# Patient Record
Sex: Female | Born: 1955
Health system: Southern US, Community
[De-identification: ages and names within clinical notes are randomized; demographics above are authoritative.]

## PROBLEM LIST (undated history)

## (undated) DIAGNOSIS — N6019 Diffuse cystic mastopathy of unspecified breast: Secondary | ICD-10-CM

## (undated) DIAGNOSIS — K639 Disease of intestine, unspecified: Secondary | ICD-10-CM

## (undated) DIAGNOSIS — K219 Gastro-esophageal reflux disease without esophagitis: Secondary | ICD-10-CM

## (undated) DIAGNOSIS — G44209 Tension-type headache, unspecified, not intractable: Secondary | ICD-10-CM

## (undated) HISTORY — DX: Gastro-esophageal reflux disease without esophagitis: K21.9

## (undated) HISTORY — PX: TUBAL LIGATION: SHX77

## (undated) HISTORY — PX: APPENDECTOMY: SHX54

## (undated) HISTORY — DX: Diffuse cystic mastopathy of unspecified breast: N60.19

## (undated) HISTORY — DX: Tension-type headache, unspecified, not intractable: G44.209

## (undated) HISTORY — PX: TONSILLECTOMY AND ADENOIDECTOMY: SHX28

## (undated) HISTORY — PX: COLONOSCOPY: SHX174

## (undated) HISTORY — PX: ABDOMINAL EXPLORATION SURGERY: SHX538

## (undated) HISTORY — DX: Disease of intestine, unspecified: K63.9

## (undated) HISTORY — PX: ABDOMINAL HYSTERECTOMY: SHX81

## (undated) HISTORY — PX: FRACTURE SURGERY: SHX138

---

## 1993-12-02 DIAGNOSIS — K639 Disease of intestine, unspecified: Secondary | ICD-10-CM

## 1993-12-02 HISTORY — DX: Disease of intestine, unspecified: K63.9

## 1994-12-02 HISTORY — PX: TOTAL ABDOMINAL HYSTERECTOMY W/ BILATERAL SALPINGOOPHORECTOMY: SHX83

## 2005-02-13 ENCOUNTER — Ambulatory Visit: Payer: Self-pay | Admitting: Internal Medicine

## 2005-10-07 ENCOUNTER — Ambulatory Visit: Payer: Self-pay | Admitting: Internal Medicine

## 2005-10-08 ENCOUNTER — Ambulatory Visit: Payer: Self-pay | Admitting: Internal Medicine

## 2006-03-14 ENCOUNTER — Ambulatory Visit: Payer: Self-pay | Admitting: Podiatry

## 2006-03-24 ENCOUNTER — Ambulatory Visit: Payer: Self-pay | Admitting: Internal Medicine

## 2006-10-28 ENCOUNTER — Ambulatory Visit: Payer: Self-pay | Admitting: Internal Medicine

## 2007-05-04 ENCOUNTER — Ambulatory Visit: Payer: Self-pay | Admitting: Unknown Physician Specialty

## 2007-07-24 DIAGNOSIS — G4700 Insomnia, unspecified: Secondary | ICD-10-CM | POA: Insufficient documentation

## 2007-07-27 DIAGNOSIS — F17200 Nicotine dependence, unspecified, uncomplicated: Secondary | ICD-10-CM | POA: Insufficient documentation

## 2007-07-27 DIAGNOSIS — K219 Gastro-esophageal reflux disease without esophagitis: Secondary | ICD-10-CM | POA: Insufficient documentation

## 2007-07-27 DIAGNOSIS — G44209 Tension-type headache, unspecified, not intractable: Secondary | ICD-10-CM | POA: Insufficient documentation

## 2007-11-17 ENCOUNTER — Ambulatory Visit: Payer: Self-pay | Admitting: Family Medicine

## 2008-10-18 ENCOUNTER — Ambulatory Visit: Payer: Self-pay | Admitting: Family Medicine

## 2008-11-01 DIAGNOSIS — F419 Anxiety disorder, unspecified: Secondary | ICD-10-CM | POA: Insufficient documentation

## 2008-11-17 ENCOUNTER — Ambulatory Visit: Payer: Self-pay | Admitting: Family Medicine

## 2008-11-23 ENCOUNTER — Ambulatory Visit: Payer: Self-pay | Admitting: Family Medicine

## 2008-12-02 HISTORY — PX: FOOT SURGERY: SHX648

## 2009-05-17 DIAGNOSIS — E559 Vitamin D deficiency, unspecified: Secondary | ICD-10-CM | POA: Insufficient documentation

## 2009-08-16 ENCOUNTER — Ambulatory Visit: Payer: Self-pay | Admitting: Family Medicine

## 2009-08-21 ENCOUNTER — Ambulatory Visit: Payer: Self-pay | Admitting: Family Medicine

## 2009-09-04 ENCOUNTER — Encounter: Payer: Self-pay | Admitting: Cardiology

## 2009-10-13 ENCOUNTER — Ambulatory Visit: Payer: Self-pay | Admitting: Unknown Physician Specialty

## 2009-12-05 DIAGNOSIS — N951 Menopausal and female climacteric states: Secondary | ICD-10-CM | POA: Insufficient documentation

## 2009-12-05 DIAGNOSIS — J309 Allergic rhinitis, unspecified: Secondary | ICD-10-CM | POA: Insufficient documentation

## 2009-12-19 ENCOUNTER — Ambulatory Visit: Payer: Self-pay | Admitting: Family Medicine

## 2010-05-01 ENCOUNTER — Encounter: Admission: RE | Admit: 2010-05-01 | Discharge: 2010-05-01 | Payer: Self-pay | Admitting: Unknown Physician Specialty

## 2010-10-10 ENCOUNTER — Encounter: Admission: RE | Admit: 2010-10-10 | Discharge: 2010-10-10 | Payer: Self-pay | Admitting: Unknown Physician Specialty

## 2010-12-02 DIAGNOSIS — N6019 Diffuse cystic mastopathy of unspecified breast: Secondary | ICD-10-CM

## 2010-12-02 DIAGNOSIS — G4730 Sleep apnea, unspecified: Secondary | ICD-10-CM

## 2010-12-02 HISTORY — DX: Sleep apnea, unspecified: G47.30

## 2010-12-02 HISTORY — DX: Diffuse cystic mastopathy of unspecified breast: N60.19

## 2011-01-15 LAB — HM PAP SMEAR: HM Pap smear: NEGATIVE

## 2011-01-16 ENCOUNTER — Ambulatory Visit: Payer: Self-pay | Admitting: Family Medicine

## 2011-02-05 ENCOUNTER — Ambulatory Visit: Payer: Self-pay | Admitting: Family Medicine

## 2011-02-07 ENCOUNTER — Ambulatory Visit: Payer: Self-pay | Admitting: Family Medicine

## 2011-07-05 ENCOUNTER — Ambulatory Visit: Payer: Self-pay | Admitting: Otolaryngology

## 2011-07-26 ENCOUNTER — Ambulatory Visit: Payer: Self-pay | Admitting: Otolaryngology

## 2012-02-06 ENCOUNTER — Ambulatory Visit: Payer: Self-pay | Admitting: General Surgery

## 2013-01-09 ENCOUNTER — Encounter: Payer: Self-pay | Admitting: *Deleted

## 2013-01-09 DIAGNOSIS — K639 Disease of intestine, unspecified: Secondary | ICD-10-CM | POA: Insufficient documentation

## 2013-01-09 DIAGNOSIS — G4730 Sleep apnea, unspecified: Secondary | ICD-10-CM | POA: Insufficient documentation

## 2013-02-09 ENCOUNTER — Encounter: Payer: Self-pay | Admitting: *Deleted

## 2013-02-09 ENCOUNTER — Ambulatory Visit: Payer: Self-pay | Admitting: General Surgery

## 2013-02-16 ENCOUNTER — Encounter: Payer: Self-pay | Admitting: *Deleted

## 2013-02-17 ENCOUNTER — Ambulatory Visit (INDEPENDENT_AMBULATORY_CARE_PROVIDER_SITE_OTHER): Payer: BC Managed Care – PPO | Admitting: General Surgery

## 2013-02-17 ENCOUNTER — Encounter: Payer: Self-pay | Admitting: General Surgery

## 2013-02-17 VITALS — BP 104/60 | HR 88 | Resp 16 | Ht 64.0 in | Wt 183.0 lb

## 2013-02-17 DIAGNOSIS — N6019 Diffuse cystic mastopathy of unspecified breast: Secondary | ICD-10-CM | POA: Insufficient documentation

## 2013-02-17 NOTE — Progress Notes (Signed)
Subjective:     Patient ID: Theresa White, female   DOB: [birth year], 57 y.o.   MRN: [id number]  HPI Patient present for a follow up mammogram done on 02/09/13 with birad category 2. No new complaints with her breasts. No complaints at this time.    Review of Systems  Constitutional: Negative.   Respiratory: Negative.   Cardiovascular: Negative.        Objective:   Physical Exam  Constitutional: She appears well-developed and well-nourished.  Eyes: No scleral icterus.  Cardiovascular: Normal rate, regular rhythm and normal heart sounds.   Pulmonary/Chest: Right breast exhibits tenderness. Right breast exhibits no inverted nipple, no mass, no nipple discharge and no skin change. Left breast exhibits tenderness. Left breast exhibits no inverted nipple, no mass, no nipple discharge and no skin change. Breasts are symmetrical.  Abdominal: Soft. Normal appearance and bowel sounds are normal.  Lymphadenopathy:    She has no cervical adenopathy.    She has no axillary adenopathy.  Neurological: She is alert.       Assessment:     Pt with FCD , stable exam. Mammogram reviewd-stable dense , nodular pattern    Plan:     F/U in 1 yr

## 2014-02-10 ENCOUNTER — Ambulatory Visit: Payer: Self-pay | Admitting: General Surgery

## 2014-02-14 ENCOUNTER — Encounter: Payer: Self-pay | Admitting: General Surgery

## 2014-02-15 NOTE — Progress Notes (Signed)
Quick Note:  Make sure the additional views are done prior to her office visit ______ 

## 2014-02-17 ENCOUNTER — Ambulatory Visit: Payer: Self-pay | Admitting: General Surgery

## 2014-02-20 ENCOUNTER — Encounter: Payer: Self-pay | Admitting: General Surgery

## 2014-02-22 ENCOUNTER — Ambulatory Visit (INDEPENDENT_AMBULATORY_CARE_PROVIDER_SITE_OTHER): Payer: BC Managed Care – PPO | Admitting: General Surgery

## 2014-02-22 ENCOUNTER — Encounter: Payer: Self-pay | Admitting: General Surgery

## 2014-02-22 VITALS — BP 140/82 | HR 78 | Resp 12 | Ht 63.0 in | Wt 191.0 lb

## 2014-02-22 DIAGNOSIS — Z86010 Personal history of colonic polyps: Secondary | ICD-10-CM

## 2014-02-22 DIAGNOSIS — N6019 Diffuse cystic mastopathy of unspecified breast: Secondary | ICD-10-CM

## 2014-02-22 NOTE — Patient Instructions (Addendum)
Patient to return in one year for bilateral screening mammogram. Continue self breast exams. Call office for any new breast issues or concerns. Colonoscopy A colonoscopy is an exam to look at the entire large intestine (colon). This exam can help find problems such as tumors, polyps, inflammation, and areas of bleeding. The exam takes about 1 hour.  LET [facility] CARE PROVIDER KNOW ABOUT:   Any allergies you have.  All medicines you are taking, including vitamins, herbs, eye drops, creams, and over-the-counter medicines.  Previous problems you or members of your family have had with the use of anesthetics.  Any blood disorders you have.  Previous surgeries you have had.  Medical conditions you have. RISKS AND COMPLICATIONS  Generally, this is a safe procedure. However, as with any procedure, complications can occur. Possible complications include:  Bleeding.  Tearing or rupture of the colon wall.  Reaction to medicines given during the exam.  Infection (rare). BEFORE THE PROCEDURE   Ask your health care provider about changing or stopping your regular medicines.  You may be prescribed an oral bowel prep. This involves drinking a large amount of medicated liquid, starting the day before your procedure. The liquid will cause you to have multiple loose stools until your stool is almost clear or light green. This cleans out your colon in preparation for the procedure.  Do not eat or drink anything else once you have started the bowel prep, unless your health care provider tells you it is safe to do so.  Arrange for someone to drive you home after the procedure. PROCEDURE   You will be given medicine to help you relax (sedative).  You will lie on your side with your knees bent.  A long, flexible tube with a light and camera on the end (colonoscope) will be inserted through the rectum and into the colon. The camera sends video back to a computer screen as it moves through the  colon. The colonoscope also releases carbon dioxide gas to inflate the colon. This helps your health care provider see the area better.  During the exam, your health care provider may take a small tissue sample (biopsy) to be examined under a microscope if any abnormalities are found.  The exam is finished when the entire colon has been viewed. AFTER THE PROCEDURE   Do not drive for 24 hours after the exam.  You may have a small amount of blood in your stool.  You may pass moderate amounts of gas and have mild abdominal cramping or bloating. This is caused by the gas used to inflate your colon during the exam.  Ask when your test results will be ready and how you will get your results. Make sure you get your test results. Document Released: 11/15/2000 Document Revised: 09/08/2013 Document Reviewed: 07/26/2013 [facility] Patient Information 2014 [place].

## 2014-02-22 NOTE — Progress Notes (Signed)
Patient ID: Theresa White, female   DOB: [birth year], 58 y.o.   MRN: [id number]  Chief Complaint  Patient presents with  . Follow-up    mammogram    HPI Theresa White is a 58 y.o. female.  who presents for her follow up mammogram and breast evaluation. The most recent mammogram was done on 02-10-14.  Patient does perform regular self breast checks and gets regular mammograms done.  No new breast complaints.   HPI  Past Medical History  Diagnosis Date  . Allergic rhinitis   . Colon polyps   . GERD (gastroesophageal reflux disease)   . Tension headache   . Chest pain   . Diffuse cystic mastopathy 2012  . Bowel trouble 1995  . Medical history non-contributory   . Sleep apnea 2012    Past Surgical History  Procedure Laterality Date  . Vaginal hysterectomy      Laparoscopically assisted with bilateral salpingo-oophorectomy, pelvic pain  . Tonsillectomy and adenoidectomy    . Abdominal exploration surgery    . Colonoscopy  2010    DR. Elliott  . Abdominal hysterectomy  1996  . Foot surgery Left 2010    Family History  Problem Relation Age of Onset  . Kidney failure Mother   . Stroke Mother   . Diabetes Mother   . Emphysema Father   . Hypertension Father   . Aneurysm Sister     Cerebral  . Cervical cancer Sister 23    Social History History  Substance Use Topics  . Smoking status: Former Smoker -- 1.00 packs/day for 30 years    Quit date: 01/29/2013  . Smokeless tobacco: Never Used  . Alcohol Use: No    Allergies  Allergen Reactions  . Codeine Itching    Current Outpatient Prescriptions  Medication Sig Dispense Refill  . Ascorbic Acid (VITAMIN C PO) Take by mouth as directed.      Marland Kitchen aspirin 81 MG tablet Take 81 mg by mouth daily.      Marland Kitchen buPROPion (WELLBUTRIN SR) 150 MG 12 hr tablet Take 150 mg by mouth daily.       Marland Kitchen CALCIUM PO Take by mouth daily.      . Cyanocobalamin (VITAMIN B-12 PO) Take by mouth daily.      Marland Kitchen esomeprazole (NEXIUM) 40 MG capsule Take 40 mg  by mouth daily before breakfast.      . estrogens, conjugated, (PREMARIN) 0.45 MG tablet Take 0.45 mg by mouth as directed. Take daily for 21 days then do not take for 7 days.      Marland Kitchen loratadine (CLARITIN) 10 MG tablet Take 10 mg by mouth daily.      . montelukast (SINGULAIR) 10 MG tablet Take 1 tablet by mouth daily.      . Multiple Vitamins-Minerals (MULTIVITAMIN PO) Take 1 tablet by mouth daily.      . Omega-3 Fatty Acids (FISH OIL PO) Take by mouth daily.      Marland Kitchen VITAMIN D, CHOLECALCIFEROL, PO Take 1 tablet by mouth daily.       No current facility-administered medications for this visit.    Review of Systems Review of Systems  Constitutional: Negative.   Respiratory: Negative.   Cardiovascular: Negative.     Blood pressure 140/82, pulse 78, resp. rate 12, height 5\' 3"  (1.6 m), weight 191 lb (86.637 kg).  Physical Exam Physical Exam  Constitutional: She is oriented to person, place, and time. She appears well-developed and well-nourished.  Eyes: Conjunctivae are normal. No  scleral icterus.  Neck: Neck supple. No thyromegaly present.  Cardiovascular: Normal rate, regular rhythm and normal heart sounds.   Pulmonary/Chest: Effort normal and breath sounds normal. Right breast exhibits no inverted nipple, no mass, no nipple discharge, no skin change and no tenderness. Left breast exhibits no inverted nipple, no mass, no nipple discharge, no skin change and no tenderness.  Lymphadenopathy:    She has no cervical adenopathy.    She has no axillary adenopathy.  Neurological: She is alert and oriented to person, place, and time.  Skin: Skin is warm and dry.    Data Reviewed Mammogram reviewed. Nodules seen on both sides- US showing cysts.  Assessment    Stable exam. History of multiple breast cysts in past. PH of colon p[olyps. She is due this yr for surveillance colonoscopy- last one done in 2010    Plan    Patient to return in one year with bilateral screening mammogram.    Colonoscopy with possible biopsy/polypectomy prn: Information regarding the procedure, including its potential risks and complications (including but not limited to perforation of the bowel, which may require emergency surgery to repair, and bleeding) was verbally given to the patient. Educational information regarding lower instestinal endoscopy was given to the patient. Written instructions for how to complete the bowel prep using Miralax were provided. The importance of drinking ample fluids to avoid dehydration as a result of the prep emphasized.   Patient will contact the office when ready to arrange colonoscopy (it may be sometime later this year). We can go ahead and arrange accordingly when patient calls. She will not require a pre-op visit.    Theresa White G 02/22/2014, 4:33 PM

## 2014-09-19 ENCOUNTER — Ambulatory Visit (INDEPENDENT_AMBULATORY_CARE_PROVIDER_SITE_OTHER): Payer: BC Managed Care – PPO | Admitting: General Surgery

## 2014-09-19 ENCOUNTER — Other Ambulatory Visit: Payer: BC Managed Care – PPO

## 2014-09-19 ENCOUNTER — Encounter: Payer: Self-pay | Admitting: General Surgery

## 2014-09-19 VITALS — BP 116/70 | HR 80 | Resp 12 | Ht 64.0 in | Wt 175.0 lb

## 2014-09-19 DIAGNOSIS — N6320 Unspecified lump in the left breast, unspecified quadrant: Secondary | ICD-10-CM

## 2014-09-19 DIAGNOSIS — Z86010 Personal history of colonic polyps: Secondary | ICD-10-CM

## 2014-09-19 DIAGNOSIS — N63 Unspecified lump in breast: Secondary | ICD-10-CM

## 2014-09-19 DIAGNOSIS — N6002 Solitary cyst of left breast: Secondary | ICD-10-CM

## 2014-09-19 MED ORDER — POLYETHYLENE GLYCOL 3350 17 GM/SCOOP PO POWD: ORAL | Status: DC

## 2014-09-19 NOTE — Progress Notes (Signed)
Patient ID: Theresa White, female   DOB: Aug 09, 1956, 58 y.o.   MRN: [id number]  Chief Complaint  Patient presents with  . Follow-up    breast cysts    HPI Theresa White is a 58 y.o. female here today for an breast check. She states her breast are tender. She feels a lump in the left breast that she noticed approximately 2 weeks ago. She believes it is a cyst. The lump has not changed in size that she has noticed. She denies any other problems at this time.   HPI  Past Medical History  Diagnosis Date  . Allergic rhinitis   . Colon polyps   . GERD (gastroesophageal reflux disease)   . Tension headache   . Chest pain   . Diffuse cystic mastopathy 2012  . Bowel trouble 1995  . Medical history non-contributory   . Sleep apnea 2012  . Colon polyps     Past Surgical History  Procedure Laterality Date  . Vaginal hysterectomy      Laparoscopically assisted with bilateral salpingo-oophorectomy, pelvic pain  . Tonsillectomy and adenoidectomy    . Abdominal exploration surgery    . Colonoscopy  2010    DR. Elliott  . Abdominal hysterectomy  1996  . Foot surgery Left 2010    Family History  Problem Relation Age of Onset  . Kidney failure Mother   . Stroke Mother   . Diabetes Mother   . Emphysema Father   . Hypertension Father   . Aneurysm Sister     Cerebral  . Cervical cancer Sister 5    Social History History  Substance Use Topics  . Smoking status: Current Every Day Smoker -- 0.75 packs/day for 30 years    Types: Cigarettes    Last Attempt to Quit: 01/29/2013  . Smokeless tobacco: Never Used  . Alcohol Use: No    Allergies  Allergen Reactions  . Codeine Itching    Current Outpatient Prescriptions  Medication Sig Dispense Refill  . Ascorbic Acid (VITAMIN C PO) Take by mouth as directed.      Marland Kitchen aspirin 81 MG tablet Take 81 mg by mouth daily.      Marland Kitchen buPROPion (WELLBUTRIN SR) 150 MG 12 hr tablet Take 150 mg by mouth daily.       Marland Kitchen CALCIUM PO Take by mouth daily.       . Cyanocobalamin (VITAMIN B-12 PO) Take by mouth daily.      Marland Kitchen esomeprazole (NEXIUM) 40 MG capsule Take 40 mg by mouth daily before breakfast.      . estrogens, conjugated, (PREMARIN) 0.45 MG tablet Take 0.45 mg by mouth as directed. Take daily for 21 days then do not take for 7 days.      Marland Kitchen loratadine (CLARITIN) 10 MG tablet Take 10 mg by mouth daily.      . montelukast (SINGULAIR) 10 MG tablet Take 1 tablet by mouth daily.      . Multiple Vitamins-Minerals (MULTIVITAMIN PO) Take 1 tablet by mouth daily.      . Omega-3 Fatty Acids (FISH OIL PO) Take by mouth daily.      . polyethylene glycol powder (GLYCOLAX/MIRALAX) powder 255 grams one bottle for colonoscopy prep  255 g  0  . VITAMIN D, CHOLECALCIFEROL, PO Take 1 tablet by mouth daily.       No current facility-administered medications for this visit.    Review of Systems Review of Systems  Constitutional: Negative.   Respiratory: Negative.  Cardiovascular: Negative.     Blood pressure 116/70, pulse 80, resp. rate 12, height 5\' 4"  (1.626 m), weight 175 lb (79.379 kg).  Physical Exam Physical Exam  Constitutional: She is oriented to person, place, and time. She appears well-developed and well-nourished.  Eyes: Conjunctivae are normal. No scleral icterus.  Neck: Neck supple. No thyromegaly present.  Cardiovascular: Normal rate, regular rhythm and normal heart sounds.   No murmur heard. Pulmonary/Chest: Effort normal and breath sounds normal. Right breast exhibits no inverted nipple, no mass, no nipple discharge, no skin change and no tenderness. Left breast exhibits mass (2.5 cm ill defined thickening 3 oclock left breast 5 cm from nipple. ). Left breast exhibits no inverted nipple, no nipple discharge, no skin change and no tenderness.  Lymphadenopathy:    She has no cervical adenopathy.    She has no axillary adenopathy.  Neurological: She is alert and oriented to person, place, and time.  Skin: Skin is warm and dry.     Data Reviewed [place] targeted over the palpable mass on left reveals a 2cm cyst and a small cyst medial to it..  Assessment    Left breast mass at 3 oclock 5 cm from nipple present. Pt with some pain. Cyst aspirated and resolved    Plan    Cyst aspiration cpmplete. F/u as scheduled next yr. Patient wishes to schedule surveillance colonoscopy. She is five years from prior. Okay to scheduled this.     Patient has been scheduled for a colonoscopy on 09-28-14 at [facility]. This patient has been asked to discontinue fish oil one week prior to procedure. It is okay for patient to continue 81 mg aspirin.    Ritta Hammes G 09/20/2014, 7:36 AM

## 2014-09-19 NOTE — Patient Instructions (Addendum)
Patient to be scheduled for colonoscopy. Continue self breast exams. Call office for any new breast issues or concerns.  Patient has been scheduled for a colonoscopy on 09-28-14 at [facility]. This patient has been asked to discontinue fish oil one week prior to procedure. It is okay for patient to continue 81 mg aspirin.

## 2014-09-20 ENCOUNTER — Encounter: Payer: Self-pay | Admitting: General Surgery

## 2014-09-20 DIAGNOSIS — Z860100 Personal history of colon polyps, unspecified: Secondary | ICD-10-CM | POA: Insufficient documentation

## 2014-09-28 ENCOUNTER — Ambulatory Visit: Payer: Self-pay | Admitting: General Surgery

## 2014-09-28 DIAGNOSIS — K5730 Diverticulosis of large intestine without perforation or abscess without bleeding: Secondary | ICD-10-CM

## 2014-09-28 DIAGNOSIS — Z86010 Personal history of colonic polyps: Secondary | ICD-10-CM

## 2014-09-28 DIAGNOSIS — D125 Benign neoplasm of sigmoid colon: Secondary | ICD-10-CM

## 2014-09-29 ENCOUNTER — Encounter: Payer: Self-pay | Admitting: General Surgery

## 2014-10-03 ENCOUNTER — Encounter: Payer: Self-pay | Admitting: General Surgery

## 2014-10-04 ENCOUNTER — Telehealth: Payer: Self-pay | Admitting: *Deleted

## 2014-10-04 NOTE — Telephone Encounter (Signed)
Notified patient as instructed, patient pleased. She is concerned about still having abdominal pain and tenderness. She states it does seem to be a shooting pain when she passes flatus, BM or voids. Denies fever, nausea or vomiting.

## 2014-10-04 NOTE — Telephone Encounter (Signed)
-----   Message from Christene Lye, MD sent at 10/04/2014 11:16 AM EST ----- Please let pt pt know the pathology was normal.

## 2014-10-14 ENCOUNTER — Ambulatory Visit: Payer: Self-pay | Admitting: Family Medicine

## 2015-02-14 ENCOUNTER — Encounter: Payer: Self-pay | Admitting: General Surgery

## 2015-02-22 ENCOUNTER — Ambulatory Visit: Payer: Self-pay | Admitting: General Surgery

## 2015-02-28 ENCOUNTER — Encounter: Payer: Self-pay | Admitting: General Surgery

## 2015-02-28 ENCOUNTER — Ambulatory Visit (INDEPENDENT_AMBULATORY_CARE_PROVIDER_SITE_OTHER): Payer: BLUE CROSS/BLUE SHIELD | Admitting: General Surgery

## 2015-02-28 VITALS — BP 130/74 | HR 77 | Resp 14 | Ht 64.0 in | Wt 174.0 lb

## 2015-02-28 DIAGNOSIS — N6019 Diffuse cystic mastopathy of unspecified breast: Secondary | ICD-10-CM

## 2015-02-28 NOTE — Patient Instructions (Signed)
Follow up in one year with mammogram and office visit.  

## 2015-02-28 NOTE — Progress Notes (Signed)
Patient ID: Theresa White, female   DOB: Sep 22, 1956, 59 y.o.   MRN: [id number]  Chief Complaint  Patient presents with  . Follow-up    mammogram    HPI Theresa White is a 59 y.o. female who presents for a breast evaluation. The most recent mammogram was done on 02/13/15. Patient does perform regular self breast checks and gets regular mammograms done. She denies any new breast problems. Colonoscopy completed last year showing diverticulosis and one hyperplastic polyp.  HPI  Past Medical History  Diagnosis Date  . Allergic rhinitis   . Colon polyps   . GERD (gastroesophageal reflux disease)   . Tension headache   . Chest pain   . Diffuse cystic mastopathy 2012  . Bowel trouble 1995  . Medical history non-contributory   . Sleep apnea 2012  . Colon polyps   . Colon polyp, hyperplastic 09/29/15    Past Surgical History  Procedure Laterality Date  . Vaginal hysterectomy      Laparoscopically assisted with bilateral salpingo-oophorectomy, pelvic pain  . Tonsillectomy and adenoidectomy    . Abdominal exploration surgery    . Colonoscopy  2010, 09/28/14    DR. Elliott  . Abdominal hysterectomy  1996  . Foot surgery Left 2010    Family History  Problem Relation Age of Onset  . Kidney failure Mother   . Stroke Mother   . Diabetes Mother   . Emphysema Father   . Hypertension Father   . Aneurysm Sister     Cerebral  . Cervical cancer Sister 26  . Bladder Cancer Brother 30    Social History History  Substance Use Topics  . Smoking status: Current Every Day Smoker -- 0.75 packs/day for 30 years    Types: Cigarettes    Last Attempt to Quit: 01/29/2013  . Smokeless tobacco: Never Used  . Alcohol Use: No    Allergies  Allergen Reactions  . Codeine Itching    Current Outpatient Prescriptions  Medication Sig Dispense Refill  . Ascorbic Acid (VITAMIN C PO) Take by mouth as directed.    Marland Kitchen aspirin 81 MG tablet Take 81 mg by mouth daily.    Marland Kitchen CALCIUM PO Take by mouth  daily.    . Cyanocobalamin (VITAMIN B-12 PO) Take by mouth daily.    Marland Kitchen esomeprazole (NEXIUM) 40 MG capsule Take 40 mg by mouth daily before breakfast.    . estrogens, conjugated, (PREMARIN) 0.45 MG tablet Take 0.45 mg by mouth as directed. Take daily for 21 days then do not take for 7 days.    Marland Kitchen loratadine (CLARITIN) 10 MG tablet Take 10 mg by mouth daily.    . montelukast (SINGULAIR) 10 MG tablet Take 1 tablet by mouth daily.    . Omega-3 Fatty Acids (FISH OIL PO) Take by mouth daily.    Marland Kitchen VITAMIN D, CHOLECALCIFEROL, PO Take 1 tablet by mouth daily.     No current facility-administered medications for this visit.    Review of Systems Review of Systems  Constitutional: Negative.   Respiratory: Negative.   Cardiovascular: Negative.     Blood pressure 130/74, pulse 77, resp. rate 14, height 5\' 4"  (1.626 m), weight 174 lb (78.926 kg).  Physical Exam Physical Exam  Constitutional: She appears well-developed and well-nourished.  Eyes: Conjunctivae are normal. No scleral icterus.  Neck: Neck supple.  Cardiovascular: Normal rate, regular rhythm and normal heart sounds.   Pulmonary/Chest: Effort normal and breath sounds normal. Right breast exhibits no inverted nipple, no  mass, no nipple discharge, no skin change and no tenderness. Left breast exhibits no inverted nipple, no mass, no nipple discharge, no skin change and no tenderness.  Abdominal: Soft. Normal appearance. There is no hepatosplenomegaly. There is no tenderness.  Lymphadenopathy:    She has no cervical adenopathy.    She has no axillary adenopathy.  Neurological: She is alert.    Data Reviewed Mammogram reviewed-stable  Assessment    Stable exam. FCD. History of colon polyps     Plan   Colonoscopy due in 2020 Follow up in one year with bilateral screening mammogram and office visit        SANKAR,SEEPLAPUTHUR G 02/28/2015, 4:16 PM

## 2015-03-22 IMAGING — CT CT HEAD WITHOUT AND WITH CONTRAST
1 of 2 series · 13 of 30 positions shown, 17 images · IV contrast (agent unspecified)
Comparison: MRI 10/07/2005.

CLINICAL DATA: Recent loss of consciousness, 09/23/2014. Recent
episode of dizziness. Vertigo.

EXAM:
CT HEAD WITHOUT AND WITH CONTRAST
TECHNIQUE: Contiguous axial images were obtained from the base of the skull
through the vertex without and with intravenous contrast
CONTRAST:  75 cc [id number]

[Series 2: head wo · axial · 0.39mm/px · z∈[+637,+757]mm · 13 of 29 slices shown, 17 images]
[im 3/29  brain]
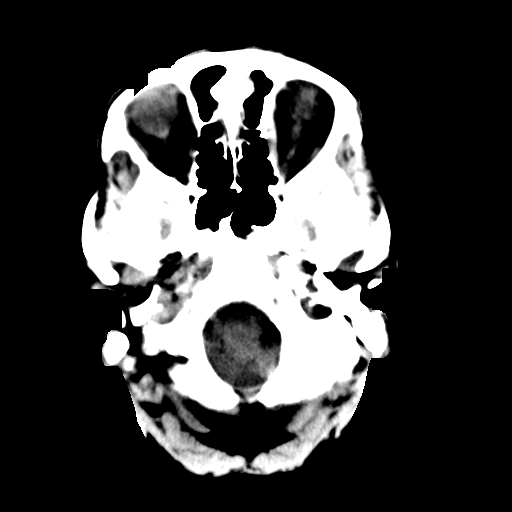
[im 3/29  bone]
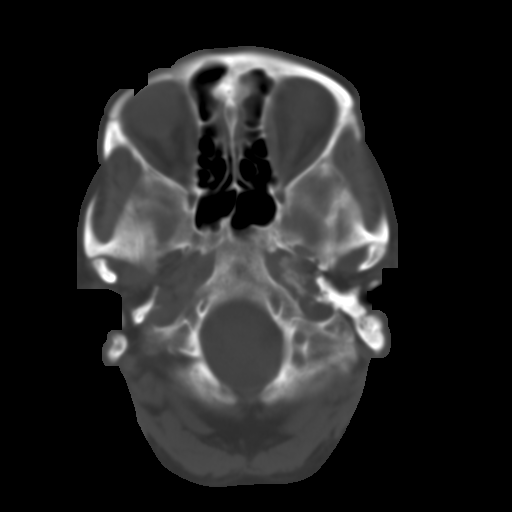
[im 5/29  brain]
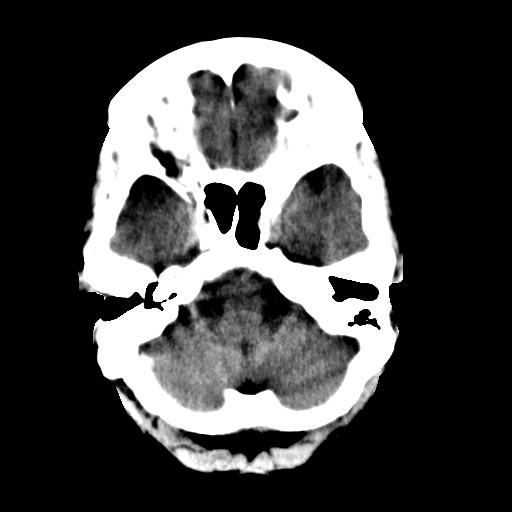
[im 7/29  brain]
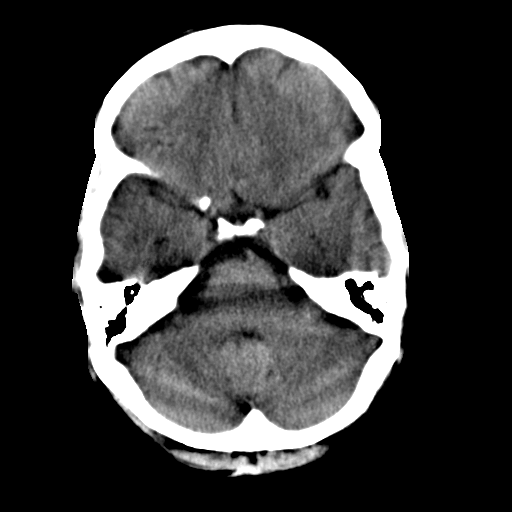
[im 9/29  brain]
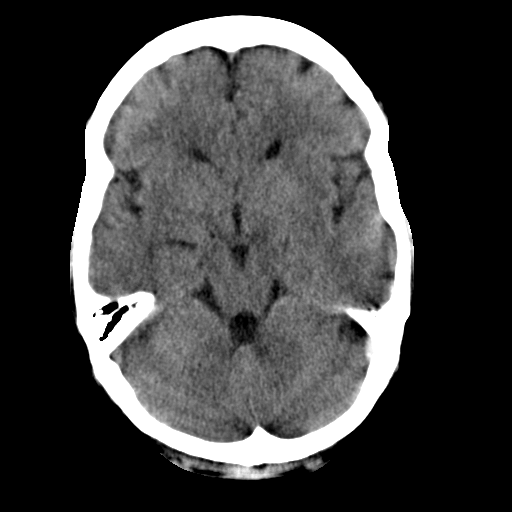
[im 11/29  brain]
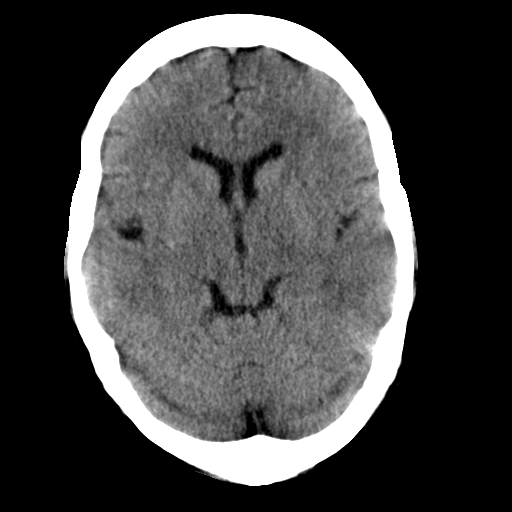
[im 11/29  bone]
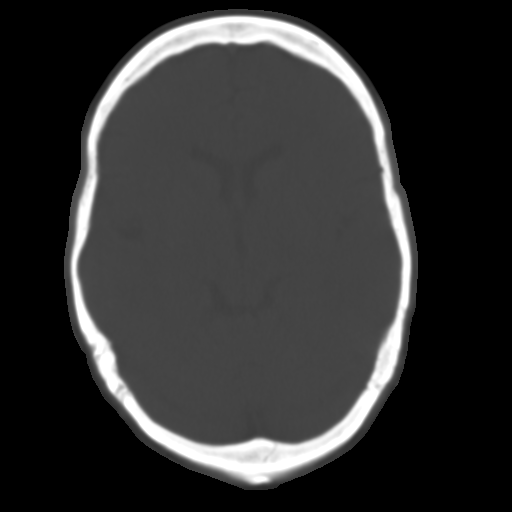
[im 13/29  brain]
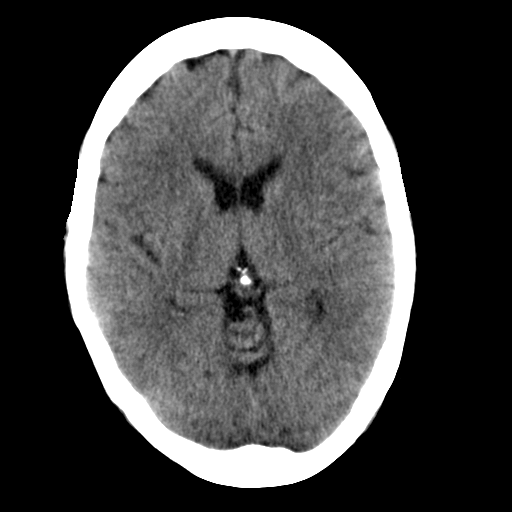
[im 15/29  brain]
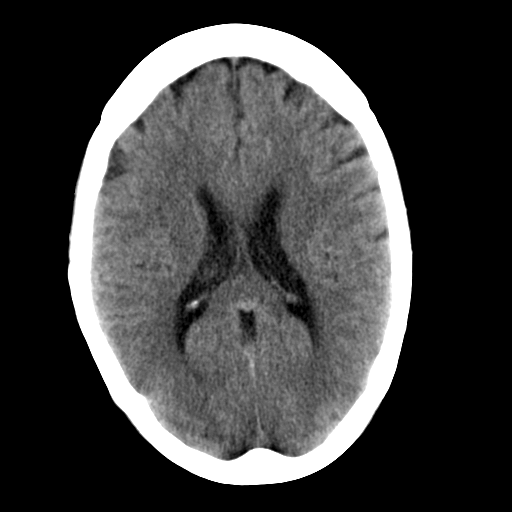
[im 17/29  brain]
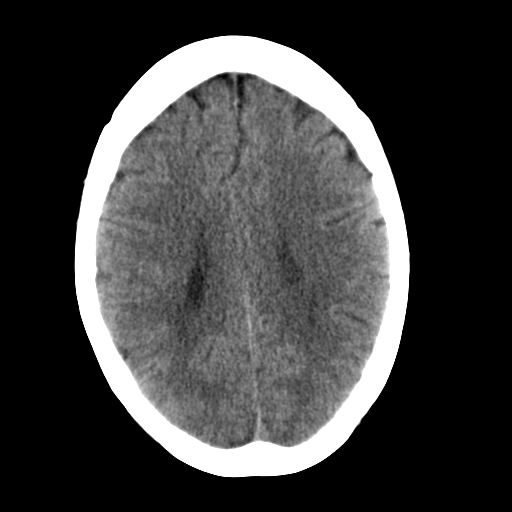
[im 19/29  brain]
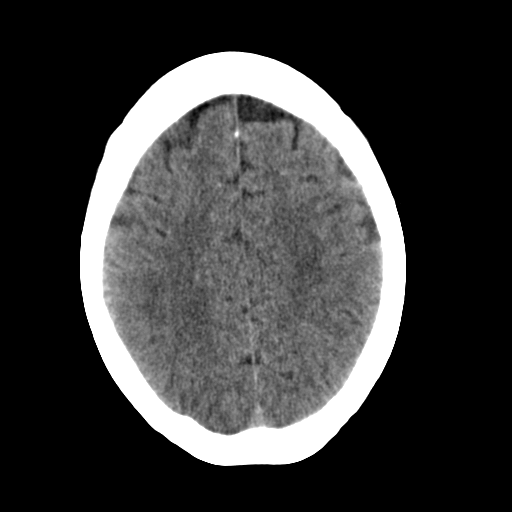
[im 19/29  bone]
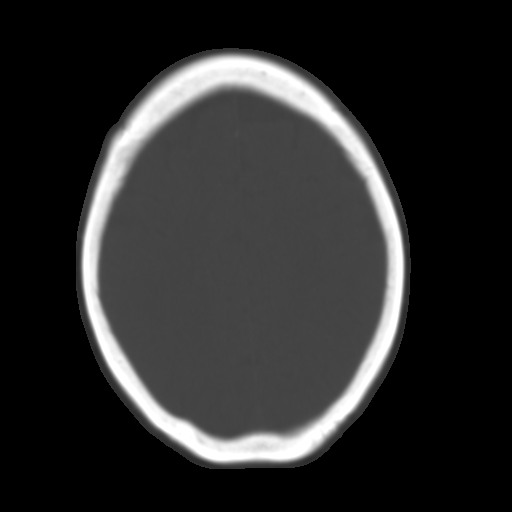
[im 21/29  brain]
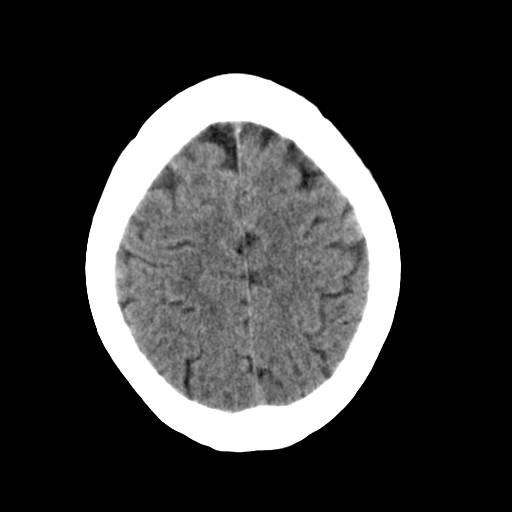
[im 23/29  brain]
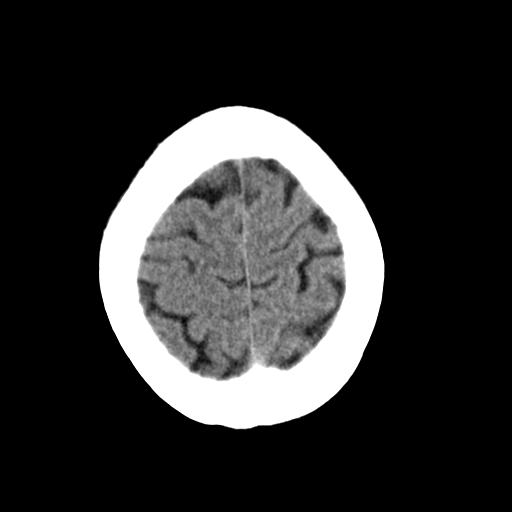
[im 25/29  brain]
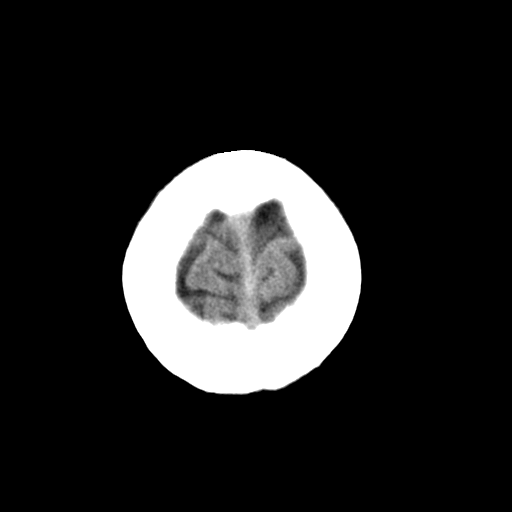
[im 27/29  brain]
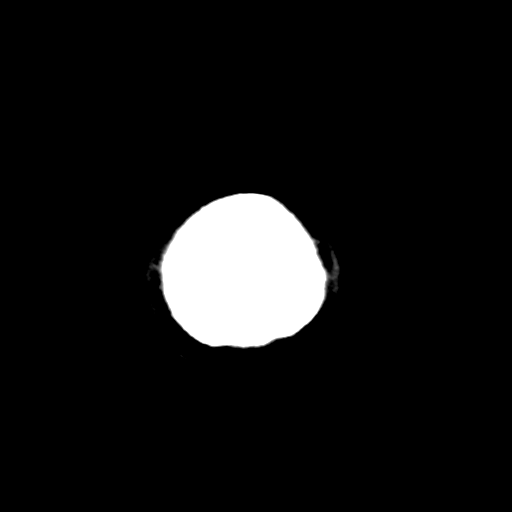
[im 27/29  bone]
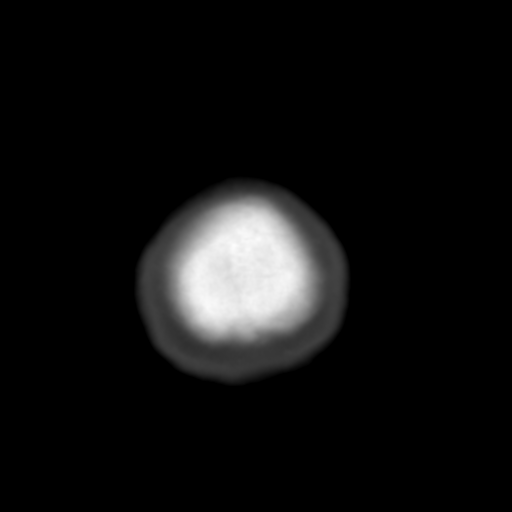

[13 of 30 positions shown; findings below may reference images not displayed]

FINDINGS: There is no evidence of acute infarction, mass lesion, hemorrhage,
hydrocephalus or extra-axial collection. There chronic appearing
small vessel ischemic changes in the white matter, most notable in
the right parietal region. No abnormal contrast enhancement. No
calvarial abnormality. Visualized sinuses, middle ears and mastoids
are clear.
IMPRESSION: No acute or significant finding. Chronic small-vessel changes of the
white matter, similar considering the previous MRI but likely
progressive.

## 2015-03-27 LAB — SURGICAL PATHOLOGY

## 2015-04-12 ENCOUNTER — Encounter: Payer: Self-pay | Admitting: Family Medicine

## 2015-04-12 ENCOUNTER — Other Ambulatory Visit: Payer: Self-pay | Admitting: Family Medicine

## 2015-04-12 DIAGNOSIS — Z720 Tobacco use: Secondary | ICD-10-CM

## 2015-04-12 DIAGNOSIS — F17210 Nicotine dependence, cigarettes, uncomplicated: Secondary | ICD-10-CM | POA: Insufficient documentation

## 2015-04-14 ENCOUNTER — Ambulatory Visit
Admission: RE | Admit: 2015-04-14 | Discharge: 2015-04-14 | Disposition: A | Discharge status: Home / Self Care | Payer: BLUE CROSS/BLUE SHIELD | Source: Ambulatory Visit | Attending: Family Medicine | Admitting: Family Medicine

## 2015-04-14 ENCOUNTER — Inpatient Hospital Stay: Payer: BLUE CROSS/BLUE SHIELD | Attending: Family Medicine | Admitting: Family Medicine

## 2015-04-14 ENCOUNTER — Encounter: Payer: Self-pay | Admitting: Family Medicine

## 2015-04-14 DIAGNOSIS — J432 Centrilobular emphysema: Secondary | ICD-10-CM | POA: Insufficient documentation

## 2015-04-14 DIAGNOSIS — F17210 Nicotine dependence, cigarettes, uncomplicated: Secondary | ICD-10-CM | POA: Diagnosis not present

## 2015-04-14 DIAGNOSIS — Z122 Encounter for screening for malignant neoplasm of respiratory organs: Secondary | ICD-10-CM | POA: Diagnosis not present

## 2015-04-14 DIAGNOSIS — Z720 Tobacco use: Secondary | ICD-10-CM

## 2015-04-14 NOTE — Progress Notes (Signed)
In accordance with CMS guidelines, patient has meet eligibility criteria including age, absence of signs or symptoms of lung cancer, the specific calculation of cigarette smoking pack-years was 30 years and is a current smoker.   A shared decision-making session was conducted prior to the performance of CT scan. This includes one or more decision aids, includes benefits and harms of screening, follow-up diagnostic testing, over-diagnosis, false positive rate, and total radiation exposure.  Counseling on the importance of adherence to annual lung cancer LDCT screening, impact of co-morbidities, and ability or willingness to undergo diagnosis and treatment is imperative for compliance of the program.  Counseling on the importance of continued smoking cessation for former smokers; the importance of smoking cessation for current smokers and information about tobacco cessation interventions have been given to patient including the [place] at [facility], 1800 quit [place], as well as Cancer Center specific smoking cessation programs.  Written order for lung cancer screening with LDCT has been given to the patient and any and all questions have been answered to the best of my abilities.   Yearly follow up will be scheduled by Shawn Perkins, Thoracic Navigator.   

## 2015-04-17 ENCOUNTER — Telehealth: Payer: Self-pay | Admitting: *Deleted

## 2015-04-17 NOTE — Telephone Encounter (Signed)
Notified patient of LDCT lung cancer screening results of Lung Rads 1. Finding with recommendation for 12 month follow up imaging. Also notified of incidental finding of emphysema.

## 2015-07-21 ENCOUNTER — Other Ambulatory Visit: Payer: Self-pay | Admitting: Family Medicine

## 2015-07-21 NOTE — Telephone Encounter (Signed)
Pt stated that she used to get Singulair tabs 10 mg from Dr. Tamala Julian when she was in our office and pt would like a refill sent to [facility] for 90 days supply. Pt also brought in a letter about not covering a medication and needing to change to a preferred drug. I will put that in Dr. Maralyn Sago mail slot up front. Thanks TNP.

## 2015-07-21 NOTE — Telephone Encounter (Signed)
Refill request for Singular 10 mg Last filled by MD on- 05/15/2014 #90 x3 Last Appt: 03/14/2015 Next Appt: none Please advise refill?

## 2015-07-22 MED ORDER — MONTELUKAST SODIUM 10 MG PO TABS: 10.0000 mg | ORAL_TABLET | Freq: Every day | ORAL | Status: DC

## 2015-09-20 ENCOUNTER — Other Ambulatory Visit: Payer: Self-pay | Admitting: Family Medicine

## 2015-09-20 MED ORDER — ESOMEPRAZOLE MAGNESIUM 40 MG PO CPDR: 40.0000 mg | DELAYED_RELEASE_CAPSULE | Freq: Every day | ORAL | Status: DC

## 2015-09-20 NOTE — Telephone Encounter (Signed)
Pt needs refill on her esomeprazole (NEXIUM) 40 MG capsule  [facility]  Call back [phone]  Thanks, Con Memos

## 2015-09-28 ENCOUNTER — Telehealth: Payer: Self-pay | Admitting: *Deleted

## 2015-09-28 NOTE — Telephone Encounter (Signed)
Patient sent an e-mail stating that her insurance will no longing cover esomeprazole 40 mg. Patient is requesting an alternate medication.

## 2015-09-28 NOTE — Telephone Encounter (Signed)
Does her insurance cover pantoprazole? She will need to check the formulary.

## 2015-10-03 NOTE — Telephone Encounter (Signed)
Patient requested that Dr. Caryn Section go ahead and send in [facility]oprazole to [facility].

## 2015-10-04 MED ORDER — PANTOPRAZOLE SODIUM 40 MG PO TBEC: 40.0000 mg | DELAYED_RELEASE_TABLET | Freq: Every day | ORAL | Status: DC

## 2016-01-16 ENCOUNTER — Other Ambulatory Visit: Payer: Self-pay | Admitting: Family Medicine

## 2016-01-26 ENCOUNTER — Ambulatory Visit: Payer: BLUE CROSS/BLUE SHIELD | Attending: Internal Medicine

## 2016-01-26 DIAGNOSIS — G4733 Obstructive sleep apnea (adult) (pediatric): Secondary | ICD-10-CM | POA: Insufficient documentation

## 2016-02-21 ENCOUNTER — Ambulatory Visit (INDEPENDENT_AMBULATORY_CARE_PROVIDER_SITE_OTHER): Payer: BLUE CROSS/BLUE SHIELD | Admitting: General Surgery

## 2016-02-21 ENCOUNTER — Encounter: Payer: Self-pay | Admitting: General Surgery

## 2016-02-21 VITALS — BP 140/82 | HR 86 | Resp 14 | Ht 64.0 in | Wt 180.0 lb

## 2016-02-21 DIAGNOSIS — N6019 Diffuse cystic mastopathy of unspecified breast: Secondary | ICD-10-CM | POA: Diagnosis not present

## 2016-02-21 DIAGNOSIS — R1011 Right upper quadrant pain: Secondary | ICD-10-CM

## 2016-02-21 NOTE — Progress Notes (Signed)
Patient ID: Theresa White, female   DOB: 1956-06-09, 60 y.o.   MRN: [id number]  Chief Complaint  Patient presents with  . Follow-up    mammogram    HPI Theresa White is a 60 y.o. female who presents for a breast evaluation. The most recent mammogram was done on 02-15-16 .  Patient does perform regular self breast checks and gets regular mammograms done.  No new breast issues. FH is unchanged. Also complains of intermittent, short duration cramping in the RUQ that sometimes radiates to the back and "takes your breath".   I have reviewed the history of present illness with the patient.  HPI  Past Medical History  Diagnosis Date  . Allergic rhinitis   . GERD (gastroesophageal reflux disease)   . Tension headache   . Chest pain   . Diffuse cystic mastopathy 2012  . Bowel trouble 1995  . Medical history non-contributory   . Sleep apnea 2012  . Colon polyps   . Tobacco abuse 04/12/2015    Past Surgical History  Procedure Laterality Date  . Vaginal hysterectomy      Laparoscopically assisted with bilateral salpingo-oophorectomy, pelvic pain  . Tonsillectomy and adenoidectomy    . Abdominal exploration surgery    . Colonoscopy  2010, 09/28/14    DR. Elliott  . Abdominal hysterectomy  1996  . Foot surgery Left 2010    Family History  Problem Relation Age of Onset  . Kidney failure Mother   . Stroke Mother   . Diabetes Mother   . Emphysema Father   . Hypertension Father   . Aneurysm Sister     Cerebral  . Cervical cancer Sister 79  . Bladder Cancer Brother 19    Social History Social History  Substance Use Topics  . Smoking status: Current Every Day Smoker -- 1.00 packs/day for 30 years    Types: Cigarettes  . Smokeless tobacco: Never Used  . Alcohol Use: No    Allergies  Allergen Reactions  . Codeine Itching    Current Outpatient Prescriptions  Medication Sig Dispense Refill  . Ascorbic Acid (VITAMIN C PO) Take by mouth as directed.    Marland Kitchen aspirin 81 MG tablet  Take 81 mg by mouth daily.    Marland Kitchen CALCIUM PO Take by mouth daily.    . Cyanocobalamin (VITAMIN B-12 PO) Take by mouth daily.    Marland Kitchen DEXILANT 60 MG capsule     . estrogens, conjugated, (PREMARIN) 0.45 MG tablet Take 0.45 mg by mouth every other day.     . loratadine (CLARITIN) 10 MG tablet Take 10 mg by mouth daily.    . montelukast (SINGULAIR) 10 MG tablet TAKE 1 TABLET DAILY 90 tablet 1  . Omega-3 Fatty Acids (FISH OIL PO) Take by mouth daily.    Marland Kitchen VITAMIN D, CHOLECALCIFEROL, PO Take 1 tablet by mouth daily.     No current facility-administered medications for this visit.    Review of Systems Review of Systems  Constitutional: Negative.   Respiratory: Negative.   Cardiovascular: Negative.     Blood pressure 140/82, pulse 86, resp. rate 14, height 5\' 4"  (1.626 m), weight 180 lb (81.647 kg).  Physical Exam Physical Exam  Constitutional: She is oriented to person, place, and time. She appears well-developed and well-nourished.  HENT:  Mouth/Throat: Oropharynx is clear and moist.  Eyes: Conjunctivae are normal. No scleral icterus.  Neck: Neck supple.  Cardiovascular: Normal rate, regular rhythm and normal heart sounds.   Pulmonary/Chest: Effort  normal and breath sounds normal. Right breast exhibits no inverted nipple, no mass, no nipple discharge, no skin change and no tenderness. Left breast exhibits no inverted nipple, no mass, no nipple discharge, no skin change and no tenderness.  Abdominal: Soft. Bowel sounds are normal. She exhibits no mass. There is no tenderness.  Lymphadenopathy:    She has no cervical adenopathy.    She has no axillary adenopathy.  Neurological: She is alert and oriented to person, place, and time.  Skin: Skin is warm and dry.  Psychiatric: Her behavior is normal.    Data Reviewed Mammogram reviewed and stable.  Assessment    Stable exam. FCD. History of colon polyps.  Potential gallstones based on history of intermittent, short duration pain in RUQ  that sometime radiates to back.      Plan    Follow up in one year with bilateral screening mammogram and office visit.  Colonoscopy due in 2020.  Ultrasound for potential gallstones.  Patient has been scheduled for a limited abdominal ultrasound attention gallbladder and liver at [facility] for 02-28-16 at 8 am (arrive 7:45 am). Prep: NPO after midnight.     PCP:  Birdie Sons This information has been scribed by Karie Fetch Lester.    Delyla Sandeen G 02/21/2016, 4:43 PM

## 2016-02-21 NOTE — Patient Instructions (Addendum)
The patient is aware to call back for any questions or concerns. Follow up in one year with bilateral screening mammogram and office visit. Colonoscopy due in 2020 Schedule ultrasound for potential gallstones.   Patient has been scheduled for a limited abdominal ultrasound attention gallbladder and liver at [facility] for 02-28-16 at 8 am (arrive 7:45 am). Prep: NPO after midnight.

## 2016-02-23 ENCOUNTER — Encounter: Payer: Self-pay | Admitting: General Surgery

## 2016-02-28 ENCOUNTER — Telehealth: Payer: Self-pay | Admitting: *Deleted

## 2016-02-28 ENCOUNTER — Ambulatory Visit
Admission: RE | Admit: 2016-02-28 | Discharge: 2016-02-28 | Disposition: A | Discharge status: Home / Self Care | Payer: BLUE CROSS/BLUE SHIELD | Source: Ambulatory Visit | Attending: General Surgery | Admitting: General Surgery

## 2016-02-28 DIAGNOSIS — R1011 Right upper quadrant pain: Secondary | ICD-10-CM | POA: Diagnosis not present

## 2016-02-28 DIAGNOSIS — K760 Fatty (change of) liver, not elsewhere classified: Secondary | ICD-10-CM | POA: Insufficient documentation

## 2016-02-28 NOTE — Telephone Encounter (Signed)
Notified patient as instructed, patient pleased. Discussed follow-up appointments as needed, patient agrees  

## 2016-02-28 NOTE — Telephone Encounter (Signed)
-----   Message from Christene Lye, MD sent at 02/28/2016  9:51 AM EDT ----- Please inform pt-US is normal. To call if her symptom worsens, otherwise f/u as scheduled

## 2016-03-18 ENCOUNTER — Ambulatory Visit (INDEPENDENT_AMBULATORY_CARE_PROVIDER_SITE_OTHER): Payer: BLUE CROSS/BLUE SHIELD | Admitting: Family Medicine

## 2016-03-18 ENCOUNTER — Encounter: Payer: Self-pay | Admitting: Family Medicine

## 2016-03-18 VITALS — BP 116/66 | HR 94 | Temp 99.7°F | Resp 17 | Wt 176.6 lb

## 2016-03-18 DIAGNOSIS — R928 Other abnormal and inconclusive findings on diagnostic imaging of breast: Secondary | ICD-10-CM | POA: Insufficient documentation

## 2016-03-18 DIAGNOSIS — K5732 Diverticulitis of large intestine without perforation or abscess without bleeding: Secondary | ICD-10-CM | POA: Insufficient documentation

## 2016-03-18 DIAGNOSIS — B349 Viral infection, unspecified: Secondary | ICD-10-CM

## 2016-03-18 DIAGNOSIS — R509 Fever, unspecified: Secondary | ICD-10-CM

## 2016-03-18 LAB — POCT INFLUENZA A/B
Influenza A, POC: NEGATIVE
Influenza B, POC: NEGATIVE

## 2016-03-18 MED ORDER — BENZONATATE 100 MG PO CAPS: 100.0000 mg | ORAL_CAPSULE | Freq: Three times a day (TID) | ORAL | Status: DC | PRN

## 2016-03-18 NOTE — Patient Instructions (Signed)
Discussed use of Mucinex D and Delsym 

## 2016-03-18 NOTE — Progress Notes (Signed)
Subjective:     Patient ID: Theresa White, female   DOB: 03/18/1956, 60 y.o.   MRN: [id number]  HPI  Chief Complaint  Patient presents with  . Cough    Patient comes in office today with concerns of cough and congestion that began on 03/14/16. Patient reports associated with cough she has had the following symptoms: fever high of 100.96, body ache, fatigue,bilateral ear pain,wheezing and shortness of breath. Patient reports taking otc Mucinex and Motrin  States sinus congestion is becoming yellow and cough is productive at times: "My chest is sore from coughing". She has not smoked for the last two days.Does not get the flu shot.   Review of Systems     Objective:   Physical Exam  Constitutional: She appears well-developed and well-nourished. No distress.  Ears: T.M's intact without inflammation Throat: Tonsils are absent Neck: no cervical adenopathy Lungs: clear     Assessment:    1. Fever, unspecified fever cause - POCT Influenza A/B  2. Viral syndrome - benzonatate (TESSALON) 100 MG capsule; Take 1 capsule (100 mg total) by mouth 3 (three) times daily as needed for cough.  Dispense: 30 capsule; Refill: 1    Plan:    Discussed use of Mucinex D and Delsym. Defers narcotic cough syrup.

## 2016-04-01 ENCOUNTER — Telehealth: Payer: Self-pay | Admitting: *Deleted

## 2016-04-01 NOTE — Telephone Encounter (Signed)
Left voicemail message for patient notifying her that it is time to schedule the recommended follow up lung cancer screening low dose CT scan. Requested patient to call back to verify information prior to the scan being scheduled.

## 2016-04-15 ENCOUNTER — Telehealth: Payer: Self-pay | Admitting: *Deleted

## 2016-04-15 NOTE — Telephone Encounter (Signed)
Unable to successfully reach patient by phone. A letter has been sent informing patient it is time for their recommended follow up low dose CT scan for lung cancer screening.

## 2016-04-22 ENCOUNTER — Ambulatory Visit
Admission: RE | Admit: 2016-04-22 | Discharge: 2016-04-22 | Disposition: A | Discharge status: Home / Self Care | Payer: BLUE CROSS/BLUE SHIELD | Source: Ambulatory Visit | Attending: Family Medicine | Admitting: Family Medicine

## 2016-04-22 ENCOUNTER — Encounter: Payer: Self-pay | Admitting: Family Medicine

## 2016-04-22 ENCOUNTER — Ambulatory Visit (INDEPENDENT_AMBULATORY_CARE_PROVIDER_SITE_OTHER): Payer: BLUE CROSS/BLUE SHIELD | Admitting: Family Medicine

## 2016-04-22 VITALS — BP 110/68 | HR 85 | Temp 98.5°F | Resp 16 | Wt 180.0 lb

## 2016-04-22 DIAGNOSIS — M542 Cervicalgia: Secondary | ICD-10-CM | POA: Diagnosis not present

## 2016-04-22 DIAGNOSIS — M50322 Other cervical disc degeneration at C5-C6 level: Secondary | ICD-10-CM | POA: Diagnosis not present

## 2016-04-22 MED ORDER — PREDNISONE 20 MG PO TABS: 20.0000 mg | ORAL_TABLET | Freq: Two times a day (BID) | ORAL | Status: AC

## 2016-04-22 MED ORDER — AMITRIPTYLINE HCL 10 MG PO TABS: 10.0000 mg | ORAL_TABLET | Freq: Every day | ORAL | Status: DC

## 2016-04-22 NOTE — Progress Notes (Signed)
Patient: Theresa White Female    DOB: [birth year]   60 y.o.   MRN: [id number] Visit Date: 04/22/2016  Today's Provider: Lelon Huh, MD   Chief Complaint  Patient presents with  . Headache   Subjective:    HPI  Pain in the back of her head on the right side since Friday 04/19/2016 in the morning.  Patient states pain is not like a headache. Pain is intermittent. It will start up out of the blue and last anywhere from a few seconds to a few minutes. It will then go completely away for a few minutes before returning. Is not triggered by any specific head movements. Is mild to moderate in intensity. Has also had some dizziness for a few weeks before pain began. Dizziness usually occurs when she turns her head a certain way. Patient has Advil, tylenol, flexeril, heat pad, and ice pack with no relief.     Allergies  Allergen Reactions  . Codeine Itching   Previous Medications   ASCORBIC ACID (VITAMIN C PO)    Take by mouth as directed.   ASPIRIN 81 MG TABLET    Take 81 mg by mouth daily.   BENZONATATE (TESSALON) 100 MG CAPSULE    Take 1 capsule (100 mg total) by mouth 3 (three) times daily as needed for cough.   CALCIUM PO    Take by mouth daily.   CYANOCOBALAMIN (VITAMIN B-12 PO)    Take by mouth daily.   DEXILANT 60 MG CAPSULE       ESTROGENS, CONJUGATED, (PREMARIN) 0.45 MG TABLET    Take 0.45 mg by mouth every other day.    LORATADINE (CLARITIN) 10 MG TABLET    Take 10 mg by mouth daily.   MONTELUKAST (SINGULAIR) 10 MG TABLET    TAKE 1 TABLET DAILY   OMEGA-3 FATTY ACIDS (FISH OIL PO)    Take by mouth daily.   VITAMIN D, CHOLECALCIFEROL, PO    Take 1 tablet by mouth daily.    Review of Systems  Constitutional: Negative for fever, chills, appetite change and fatigue.  Respiratory: Negative for chest tightness and shortness of breath.   Cardiovascular: Negative for chest pain and palpitations.  Gastrointestinal: Negative for nausea, vomiting and abdominal pain.    Neurological: Positive for dizziness (none the last few days). Negative for weakness and light-headedness.  No visual disturbances.   Social History  Substance Use Topics  . Smoking status: Current Every Day Smoker -- 1.00 packs/day for 30 years    Types: Cigarettes  . Smokeless tobacco: Never Used  . Alcohol Use: No   Objective:   BP 110/68 mmHg  Pulse 85  Temp(Src) 98.5 F (36.9 C) (Oral)  Resp 16  Wt 180 lb (81.647 kg)  SpO2 98%  Physical Exam   General Appearance:    Alert, cooperative, no distress  Eyes:    PERRL, conjunctiva/corneas clear, EOM's intact       Neck:     FROM without pain. Area of pain is about 1.5 cm right of midline at occiput. No tenderness of C-spine. Mild tenderness of peri-cervical muscles a few cm below area pain. No swelling or other gross deformities.   Heart:    Regular rate and rhythm. No carotid bruits.   Neurologic:   Awake, alert, oriented x 3. No apparent focal neurological           defect.  No cerebellar deficits.  Assessment & Plan:     1. Neck pain No c/w intracranial etiology. Suspect neuralgia, possible spasm. Rule out c-spine pathology.   - DG Cervical Spine Complete; Future - predniSONE (DELTASONE) 20 MG tablet; Take 1 tablet (20 mg total) by mouth 2 (two) times daily.  Dispense: 14 tablet; Refill: 0 - amitriptyline (ELAVIL) 10 MG tablet; Take 1-2 tablets (10-20 mg total) by mouth at bedtime.  Dispense: 30 tablet; Refill: 0     The entirety of the information documented in the History of Present Illness, Review of Systems and Physical Exam were personally obtained by me. Portions of this information were initially documented by April M. Sabra Heck, CMA and reviewed by me for thoroughness and accuracy.    Lelon Huh, MD  [facility]

## 2016-04-23 ENCOUNTER — Telehealth: Payer: Self-pay

## 2016-04-23 NOTE — Telephone Encounter (Signed)
-----   Message from Birdie Sons, MD sent at 04/23/2016  8:04 AM EDT ----- Arthritis with some spurs in cervical spine. Signs of muscle spasm based on the way cervical spine is curved. I think this should improve with prednisone and amitriptyline that was prescribed yesterday. Call if not greatly improved by the end of the week.

## 2016-04-23 NOTE — Telephone Encounter (Signed)
Left message to call back  

## 2016-04-23 NOTE — Telephone Encounter (Signed)
Advised patient as below.  

## 2016-05-22 DIAGNOSIS — G4733 Obstructive sleep apnea (adult) (pediatric): Secondary | ICD-10-CM | POA: Diagnosis not present

## 2016-05-24 ENCOUNTER — Encounter: Payer: BLUE CROSS/BLUE SHIELD | Admitting: Family Medicine

## 2016-06-07 ENCOUNTER — Encounter: Payer: Self-pay | Admitting: Family Medicine

## 2016-06-07 ENCOUNTER — Ambulatory Visit (INDEPENDENT_AMBULATORY_CARE_PROVIDER_SITE_OTHER): Payer: BLUE CROSS/BLUE SHIELD | Admitting: Family Medicine

## 2016-06-07 VITALS — BP 110/70 | HR 94 | Temp 98.8°F | Resp 16 | Ht 63.0 in | Wt 180.0 lb

## 2016-06-07 DIAGNOSIS — F17210 Nicotine dependence, cigarettes, uncomplicated: Secondary | ICD-10-CM

## 2016-06-07 DIAGNOSIS — J309 Allergic rhinitis, unspecified: Secondary | ICD-10-CM

## 2016-06-07 DIAGNOSIS — K219 Gastro-esophageal reflux disease without esophagitis: Secondary | ICD-10-CM | POA: Diagnosis not present

## 2016-06-07 DIAGNOSIS — E559 Vitamin D deficiency, unspecified: Secondary | ICD-10-CM | POA: Diagnosis not present

## 2016-06-07 DIAGNOSIS — Z0000 Encounter for general adult medical examination without abnormal findings: Secondary | ICD-10-CM | POA: Diagnosis not present

## 2016-06-07 MED ORDER — MONTELUKAST SODIUM 10 MG PO TABS: 10.0000 mg | ORAL_TABLET | Freq: Every day | ORAL | Status: DC

## 2016-06-07 MED ORDER — BUPROPION HCL ER (SR) 150 MG PO TB12: ORAL_TABLET | ORAL | Status: DC

## 2016-06-07 NOTE — Progress Notes (Signed)
Patient: Theresa White, Female    DOB: 04-05-1956, 60 y.o.   MRN: [id number] Visit Date: 06/07/2016  Today's Provider: Lelon Huh, MD   Chief Complaint  Patient presents with  . Annual Exam  . Gastroesophageal Reflux   Subjective:    Annual physical exam Theresa White is a 60 y.o. female who presents today for health maintenance and complete physical. She feels fairly well. She reports never exercising. She reports she is sleeping fairly well. She has already had mammogram and has had follow up with Dr.Sankar for breast exam.  ----------------------------------------------------------------- Follow up Acid reflux:  Patient was last seen for this problem 1 year ago and no changes were made. Patient reports good compliance with treatment, good tolerance and good symptom control.   Follow up Vitamin D deficiency:  Last office visit was 1 year ago and no changes were made.  Patient reports good compliance with treatment.   Follow up Post menopausal symptoms:  Patient was last seen for this problem 2 years ago. Management during taht visit includes advising patient to wean estrogen replacement as tolerated.  Patient comes in today reporting that she stopped taking the Premarin on her 60th birthday was 01/05/2016 and has been doing well off of HRT.      Review of Systems  Constitutional: Negative for fever, chills and fatigue.  HENT: Negative for congestion, ear pain, rhinorrhea, sneezing and sore throat.   Eyes: Negative.  Negative for pain and redness.  Respiratory: Negative for cough, shortness of breath and wheezing.   Cardiovascular: Negative for chest pain and leg swelling.  Gastrointestinal: Negative for nausea, abdominal pain, diarrhea, constipation and blood in stool.  Endocrine: Negative for polydipsia and polyphagia.  Genitourinary: Negative.  Negative for dysuria, hematuria, flank pain, vaginal bleeding, vaginal discharge and pelvic pain.  Musculoskeletal:  Negative for back pain, joint swelling, arthralgias and gait problem.  Skin: Negative for rash.  Neurological: Negative.  Negative for dizziness, tremors, seizures, weakness, light-headedness, numbness and headaches.  Hematological: Negative for adenopathy.  Psychiatric/Behavioral: Negative.  Negative for behavioral problems, confusion and dysphoric mood. The patient is not nervous/anxious and is not hyperactive.     Social History      She  reports that she has been smoking Cigarettes.  She has a 30 pack-year smoking history. She has never used smokeless tobacco. She reports that she does not drink alcohol or use illicit drugs.       Social History   Social History  . Marital Status: Married    Spouse Name: N/A  . Number of Children: 2  . Years of Education: N/A   Occupational History  . Production planning    Social History Main Topics  . Smoking status: Current Every Day Smoker -- 1.00 packs/day for 30 years    Types: Cigarettes  . Smokeless tobacco: Never Used  . Alcohol Use: No  . Drug Use: No  . Sexual Activity: Not Asked   Other Topics Concern  . None   Social History Narrative    Past Medical History  Diagnosis Date  . Allergic rhinitis   . GERD (gastroesophageal reflux disease)   . Tension headache   . Chest pain   . Diffuse cystic mastopathy 2012  . Bowel trouble 1995  . Medical history non-contributory   . Sleep apnea 2012  . Colon polyps   . Tobacco abuse 04/12/2015     Patient Active Problem List   Diagnosis Date Noted  .  Diverticulitis of colon 03/18/2016  . Abnormal mammogram 03/18/2016  . Tobacco abuse 04/12/2015  . History of colonic polyps 09/20/2014  . Diffuse cystic mastopathy 02/17/2013  . Sleep apnea   . Allergic rhinitis 12/05/2009  . Post menopausal syndrome 12/05/2009  . Avitaminosis D 05/17/2009  . Anxiety disorder 11/01/2008  . Acid reflux 07/27/2007  . Headache, tension-type 07/27/2007  . Compulsive tobacco user syndrome  07/27/2007  . Cannot sleep 07/24/2007    Past Surgical History  Procedure Laterality Date  . Vaginal hysterectomy      Laparoscopically assisted with bilateral salpingo-oophorectomy, pelvic pain  . Tonsillectomy and adenoidectomy    . Abdominal exploration surgery    . Colonoscopy  2010, 09/28/14    DR. Elliott  . Abdominal hysterectomy  1996  . Foot surgery Left 2010    Family History        Family Status  Relation Status Death Age  . Mother Deceased 47  . Father Deceased 60  . Sister Deceased 59  . Sister Deceased 9        Her family history includes Aneurysm in her sister; Bladder Cancer (age of onset: 10) in her brother; Cervical cancer (age of onset: 26) in her sister; Diabetes in her mother; Emphysema in her father; Hypertension in her father; Kidney failure in her mother; Stroke in her mother.    Allergies  Allergen Reactions  . Codeine Itching    Current Meds  Medication Sig  . Ascorbic Acid (VITAMIN C PO) Take by mouth as directed.  Marland Kitchen aspirin 81 MG tablet Take 81 mg by mouth daily.  Marland Kitchen CALCIUM PO Take by mouth daily.  . Cyanocobalamin (VITAMIN B-12 PO) Take by mouth daily.  Marland Kitchen DEXILANT 60 MG capsule   . loratadine (CLARITIN) 10 MG tablet Take 10 mg by mouth daily.  . montelukast (SINGULAIR) 10 MG tablet Take 1 tablet (10 mg total) by mouth daily.  . Omega-3 Fatty Acids (FISH OIL PO) Take by mouth daily.  Marland Kitchen VITAMIN D, CHOLECALCIFEROL, PO Take 1 tablet by mouth daily.  . [DISCONTINUED] amitriptyline (ELAVIL) 10 MG tablet Take 1-2 tablets (10-20 mg total) by mouth at bedtime.  . [DISCONTINUED] benzonatate (TESSALON) 100 MG capsule Take 1 capsule (100 mg total) by mouth 3 (three) times daily as needed for cough.  . [DISCONTINUED] estrogens, conjugated, (PREMARIN) 0.45 MG tablet Take 0.45 mg by mouth every other day.   . [DISCONTINUED] montelukast (SINGULAIR) 10 MG tablet TAKE 1 TABLET DAILY    Patient Care Team: Birdie Sons, MD as PCP - General (Family  Medicine) Seeplaputhur Robinette Haines, MD (General Surgery)     Objective:   Vitals: BP 110/70 mmHg  Pulse 94  Temp(Src) 98.8 F (37.1 C) (Oral)  Resp 16  Ht 5\' 3"  (1.6 m)  Wt 180 lb (81.647 kg)  BMI 31.89 kg/m2  SpO2 95%   Physical Exam   General Appearance:    Alert, cooperative, no distress, appears stated age  Head:    Normocephalic, without obvious abnormality, atraumatic  Eyes:    PERRL, conjunctiva/corneas clear, EOM's intact, fundi    benign, both eyes  Ears:    Normal TM's and external ear canals, both ears  Nose:   Nares normal, septum midline, mucosa normal, no drainage    or sinus tenderness  Throat:   Lips, mucosa, and tongue normal; teeth and gums normal  Neck:   Supple, symmetrical, trachea midline, no adenopathy;    thyroid:  no enlargement/tenderness/nodules; no carotid  bruit or JVD  Back:     Symmetric, no curvature, ROM normal, no CVA tenderness  Lungs:     Clear to auscultation bilaterally, respirations unlabored  Chest Wall:    No tenderness or deformity   Heart:    Regular rate and rhythm, S1 and S2 normal, no murmur, rub   or gallop  Breast Exam:    deferred  Abdomen:     Soft, non-tender, bowel sounds active all four quadrants,    no masses, no organomegaly  Pelvic:    deferred  Extremities:   Extremities normal, atraumatic, no cyanosis or edema  Pulses:   2+ and symmetric all extremities  Skin:   Skin color, texture, turgor normal, no rashes or lesions  Lymph nodes:   Cervical, supraclavicular, and axillary nodes normal  Neurologic:   CNII-XII intact, normal strength, sensation and reflexes    throughout    Depression Screen PHQ 2/9 Scores 06/07/2016  PHQ - 2 Score 0  PHQ- 9 Score 1      Assessment & Plan:     Routine Health Maintenance and Physical Exam  Exercise Activities and Dietary recommendations Goals    None       There is no immunization history on file for this patient.  Health Maintenance  Topic Date Due  . HIV  Screening  01/04/1971  . TETANUS/TDAP  01/04/1975  . ZOSTAVAX  01/05/2016  . INFLUENZA VACCINE  07/02/2016  . MAMMOGRAM  02/14/2018  . COLONOSCOPY  09/28/2024  . Hepatitis C Screening  Completed      Discussed health benefits of physical activity, and encouraged her to engage in regular exercise appropriate for her age and condition.    -------------------------------------------------------------------- 1. Annual physical exam   2. Smoking greater than 30 pack years  - CT CHEST LUNG CANCER SCREENING LOW DOSE WO CONTRAST; Future  - buPROPion (WELLBUTRIN SR) 150 MG 12 hr tablet; 1 tablet daily for 3 days, then 1 tablet twice daily. Stop smoking 14 days after starting medication  Dispense: 60 tablet; Refill: 6  3. Avitaminosis D  - VITAMIN D 25 Hydroxy (Vit-D Deficiency, Fractures)  4. Allergic rhinitis, unspecified allergic rhinitis type - montelukast (SINGULAIR) 10 MG tablet; Take 1 tablet (10 mg total) by mouth daily.  Dispense: 90 tablet; Refill: 1  5. Gastroesophageal reflux disease without esophagitis Well controlled.  Continue current medications.   - Magnesium  Recommended Tdap and Zostavax which she declined.    Lelon Huh, MD  [facility]

## 2016-06-08 LAB — MAGNESIUM: Magnesium: 1.9 mg/dL (ref 1.6–2.3)

## 2016-06-08 LAB — VITAMIN D 25 HYDROXY (VIT D DEFICIENCY, FRACTURES): Vit D, 25-Hydroxy: 43.9 ng/mL (ref 30.0–100.0)

## 2016-06-14 ENCOUNTER — Telehealth: Payer: Self-pay

## 2016-06-14 NOTE — Telephone Encounter (Signed)
Pt advised.   Thanks,   -Anila Bojarski  

## 2016-06-14 NOTE — Telephone Encounter (Signed)
-----   Message from Birdie Sons, MD sent at 06/14/2016  8:17 AM EDT ----- Normal vitamin D and magnesium levels. check yearly.

## 2016-06-18 DIAGNOSIS — K219 Gastro-esophageal reflux disease without esophagitis: Secondary | ICD-10-CM | POA: Diagnosis not present

## 2016-07-02 DIAGNOSIS — R238 Other skin changes: Secondary | ICD-10-CM | POA: Diagnosis not present

## 2016-07-02 DIAGNOSIS — L570 Actinic keratosis: Secondary | ICD-10-CM | POA: Diagnosis not present

## 2016-07-02 DIAGNOSIS — L820 Inflamed seborrheic keratosis: Secondary | ICD-10-CM | POA: Diagnosis not present

## 2016-07-02 DIAGNOSIS — L738 Other specified follicular disorders: Secondary | ICD-10-CM | POA: Diagnosis not present

## 2016-07-02 DIAGNOSIS — L298 Other pruritus: Secondary | ICD-10-CM | POA: Diagnosis not present

## 2016-07-02 DIAGNOSIS — L538 Other specified erythematous conditions: Secondary | ICD-10-CM | POA: Diagnosis not present

## 2016-08-22 DIAGNOSIS — G4733 Obstructive sleep apnea (adult) (pediatric): Secondary | ICD-10-CM | POA: Diagnosis not present

## 2016-09-04 DIAGNOSIS — G4733 Obstructive sleep apnea (adult) (pediatric): Secondary | ICD-10-CM | POA: Diagnosis not present

## 2016-10-05 DIAGNOSIS — G4733 Obstructive sleep apnea (adult) (pediatric): Secondary | ICD-10-CM | POA: Diagnosis not present

## 2016-11-04 DIAGNOSIS — G4733 Obstructive sleep apnea (adult) (pediatric): Secondary | ICD-10-CM | POA: Diagnosis not present

## 2016-11-15 ENCOUNTER — Other Ambulatory Visit: Payer: Self-pay

## 2016-11-15 ENCOUNTER — Telehealth: Payer: Self-pay

## 2016-11-15 ENCOUNTER — Other Ambulatory Visit: Payer: Self-pay | Admitting: General Surgery

## 2016-11-15 DIAGNOSIS — R1032 Left lower quadrant pain: Secondary | ICD-10-CM

## 2016-11-15 MED ORDER — CIPROFLOXACIN HCL 500 MG PO TABS: 500.0000 mg | ORAL_TABLET | Freq: Two times a day (BID) | ORAL | 0 refills | Status: AC

## 2016-11-15 MED ORDER — METRONIDAZOLE 500 MG PO TABS: 500.0000 mg | ORAL_TABLET | Freq: Three times a day (TID) | ORAL | 0 refills | Status: AC

## 2016-11-15 NOTE — Telephone Encounter (Signed)
Antibiotics ordered and patient to have a CT scan.

## 2016-11-15 NOTE — Addendum Note (Signed)
Addended by: Lesly Rubenstein on: 11/15/2016 11:59 AM   Modules accepted: Orders

## 2016-11-15 NOTE — Telephone Encounter (Signed)
Patient called and was concerned that she may be having symptoms of recurrent diverticulitis. She state that she started having left sided pain yesterday afternoon. She states that the pain is constant. She reports no fever yet. Her last episode was 3 years ago. She was wondering if she might have an antibiotic called in to try to get ahead of this. Dr Jamal Collin called.

## 2016-11-15 NOTE — Telephone Encounter (Signed)
Pt has pain in llq of abdomen-similar to her last episode 3 yrs ago. No fever or chills. Some change in her bowels.  Last time she was treated with antibiotics. No CT has been done. Discussed with pt. Reccommended antibiotic treatment and also a CT to document diagnosis. She is agreeable.

## 2016-11-15 NOTE — Telephone Encounter (Signed)
The patient is scheduled for a CT abdomen/pelvis at [facility] on 11/22/16 at 9:00 am. She will arrive at 8:15 am and have labs done. She will pick up a prep kit and have nothing to eat or drink for 4 hours prior to her scan. The patient is aware of date, time, and instructions.

## 2016-11-20 DIAGNOSIS — G4733 Obstructive sleep apnea (adult) (pediatric): Secondary | ICD-10-CM | POA: Diagnosis not present

## 2016-11-22 ENCOUNTER — Ambulatory Visit
Admission: RE | Admit: 2016-11-22 | Discharge: 2016-11-22 | Disposition: A | Discharge status: Home / Self Care | Payer: BLUE CROSS/BLUE SHIELD | Source: Ambulatory Visit | Attending: General Surgery | Admitting: General Surgery

## 2016-11-22 DIAGNOSIS — K5730 Diverticulosis of large intestine without perforation or abscess without bleeding: Secondary | ICD-10-CM | POA: Diagnosis not present

## 2016-11-22 DIAGNOSIS — K760 Fatty (change of) liver, not elsewhere classified: Secondary | ICD-10-CM | POA: Diagnosis not present

## 2016-11-22 DIAGNOSIS — R1032 Left lower quadrant pain: Secondary | ICD-10-CM

## 2016-11-22 LAB — POCT I-STAT CREATININE: CREATININE: 0.8 mg/dL (ref 0.44–1.00)

## 2016-11-22 MED ORDER — IOPAMIDOL (ISOVUE-300) INJECTION 61%
100.0000 mL | Freq: Once | INTRAVENOUS | Status: AC | PRN
  Administered 2016-11-22: 100 mL via INTRAVENOUS

## 2016-12-03 ENCOUNTER — Telehealth: Payer: Self-pay | Admitting: *Deleted

## 2016-12-03 NOTE — Telephone Encounter (Signed)
Called asking for recent CT results?

## 2016-12-04 NOTE — Telephone Encounter (Signed)
She is aware that the CT showed diverticulosis per Dr Jamal Collin. She thinks she is better no symptoms now. She will try miralax daily to see if that helps regulate her bowels. She will monitor symptoms and call for follow up if symptoms reoccur.

## 2016-12-05 DIAGNOSIS — G4733 Obstructive sleep apnea (adult) (pediatric): Secondary | ICD-10-CM | POA: Diagnosis not present

## 2016-12-18 ENCOUNTER — Other Ambulatory Visit: Payer: Self-pay | Admitting: Family Medicine

## 2016-12-18 DIAGNOSIS — F17210 Nicotine dependence, cigarettes, uncomplicated: Secondary | ICD-10-CM

## 2017-01-03 DIAGNOSIS — K219 Gastro-esophageal reflux disease without esophagitis: Secondary | ICD-10-CM | POA: Diagnosis not present

## 2017-01-03 DIAGNOSIS — H6063 Unspecified chronic otitis externa, bilateral: Secondary | ICD-10-CM | POA: Diagnosis not present

## 2017-01-03 DIAGNOSIS — J340 Abscess, furuncle and carbuncle of nose: Secondary | ICD-10-CM | POA: Diagnosis not present

## 2017-01-30 ENCOUNTER — Ambulatory Visit (INDEPENDENT_AMBULATORY_CARE_PROVIDER_SITE_OTHER): Payer: BLUE CROSS/BLUE SHIELD | Admitting: Family Medicine

## 2017-01-30 VITALS — BP 128/64 | HR 76 | Temp 98.2°F | Resp 16 | Wt 183.0 lb

## 2017-01-30 DIAGNOSIS — K529 Noninfective gastroenteritis and colitis, unspecified: Secondary | ICD-10-CM

## 2017-01-30 MED ORDER — METRONIDAZOLE 500 MG PO TABS: 500.0000 mg | ORAL_TABLET | Freq: Two times a day (BID) | ORAL | 0 refills | Status: AC

## 2017-01-30 NOTE — Progress Notes (Signed)
Theresa White  MRN: [id number] DOB: [birth year]  Subjective:  HPI   The patient is a 61 year old female who presents with 5 days of diarrhea.  She states on day 2 and 3 she had fever and vomiting but none of that today.  She thought yesterday she was better and went to work however last night started back with diarrhea.  Today she has had 10 watery stools since 5 am.  She states she has seen blood on the toilet tissue prior to these episodes and continuing with some today.  She has not had any abdominal pain. She is concerned about dehydration however has not taken anything to stop the diarrhea due to her history of diverticulosis and diverticulitis.  She does states that this does not appear to her to be similar to her peovous bouts of diverticulitis.  Patient Active Problem List   Diagnosis Date Noted  . Diverticulitis of colon 03/18/2016  . Abnormal mammogram 03/18/2016  . Tobacco abuse 04/12/2015  . History of colonic polyps 09/20/2014  . Diffuse cystic mastopathy 02/17/2013  . Sleep apnea   . Allergic rhinitis 12/05/2009  . Post menopausal syndrome 12/05/2009  . Avitaminosis D 05/17/2009  . Anxiety disorder 11/01/2008  . Acid reflux 07/27/2007  . Headache, tension-type 07/27/2007  . Compulsive tobacco user syndrome 07/27/2007  . Cannot sleep 07/24/2007    Past Medical History:  Diagnosis Date  . Allergic rhinitis   . Bowel trouble 1995  . Chest pain   . Colon polyps   . Diffuse cystic mastopathy 2012  . GERD (gastroesophageal reflux disease)   . Medical history non-contributory   . Sleep apnea 2012  . Tension headache   . Tobacco abuse 04/12/2015    Social History   Social History  . Marital status: Married    Spouse name: N/A  . Number of children: 2  . Years of education: N/A   Occupational History  . Production planning    Social History Main Topics  . Smoking status: Current Every Day Smoker    Packs/day: 1.00    Years: 30.00    Types: Cigarettes  .  Smokeless tobacco: Never Used  . Alcohol use No  . Drug use: No  . Sexual activity: Not on file   Other Topics Concern  . Not on file   Social History Narrative  . No narrative on file    Outpatient Encounter Prescriptions as of 01/30/2017  Medication Sig Note  . Ascorbic Acid (VITAMIN C PO) Take by mouth as directed.   Marland Kitchen aspirin 81 MG tablet Take 81 mg by mouth daily.   Marland Kitchen CALCIUM PO Take by mouth daily.   . Cyanocobalamin (VITAMIN B-12 PO) Take by mouth daily.   Marland Kitchen DEXILANT 60 MG capsule  02/21/2016: Received from: External Pharmacy  . loratadine (CLARITIN) 10 MG tablet Take 10 mg by mouth daily.   . montelukast (SINGULAIR) 10 MG tablet TAKE 1 TABLET DAILY   . Omega-3 Fatty Acids (FISH OIL PO) Take by mouth daily.   Marland Kitchen VITAMIN D, CHOLECALCIFEROL, PO Take 1 tablet by mouth daily.   Marland Kitchen buPROPion (WELLBUTRIN SR) 150 MG 12 hr tablet 1 tablet daily for 3 days, then 1 tablet twice daily. Stop smoking 14 days after starting medication    No facility-administered encounter medications on file as of 01/30/2017.     Allergies  Allergen Reactions  . Codeine Itching    Review of Systems  Constitutional: Positive for chills, fever, malaise/fatigue  and weight loss.  Respiratory: Negative for cough, shortness of breath and wheezing.   Cardiovascular: Positive for chest pain, palpitations, orthopnea and leg swelling.  Gastrointestinal: Positive for diarrhea, heartburn, nausea and vomiting. Negative for abdominal pain, blood in stool (blood on toilet tissue), constipation and melena.       Belching  Neurological: Positive for weakness.    Objective:  BP 128/64 (BP Location: Left Arm, Patient Position: Sitting, Cuff Size: Normal)   Pulse 76   Temp 98.2 F (36.8 C) (Oral)   Resp 16   Wt 183 lb (83 kg)   BMI 32.42 kg/m   Physical Exam  General Appearance:    Alert, cooperative, no distress  Eyes:    PERRL, conjunctiva/corneas clear, EOM's intact       Lungs:     Clear to auscultation  bilaterally, respirations unlabored  Heart:    Regular rate and rhythm  Abdomen:    bowel sounds present and normal in all 4 quadrants, soft or round. Mild diffuse tenderness. No CVA tenderness     Assessment and Plan :  No diagnosis found. 1. Gastroenteritis OTC probiotics. Kaopectate prn. Dietary instructions provided.  - metroNIDAZOLE (FLAGYL) 500 MG tablet; Take 1 tablet (500 mg total) by mouth 2 (two) times daily.  Dispense: 20 tablet; Refill: 0

## 2017-01-30 NOTE — Patient Instructions (Addendum)
   Start taking OTC Probiotics such as [employer] may take one or two doses of Kaopectate   5    Diarrhea, Adult Diarrhea is frequent loose and watery bowel movements. Diarrhea can make you feel weak and cause you to become dehydrated. Dehydration can make you tired and thirsty, cause you to have a dry mouth, and decrease how often you urinate. Diarrhea typically lasts 2-3 days. However, it can last longer if it is a sign of something more serious. It is important to treat your diarrhea as told by your health care provider. Follow these instructions at home: Eating and drinking   Follow these recommendations as told by your health care provider:  Take an oral rehydration solution (ORS). This is a drink that is sold at pharmacies and retail stores.  Drink clear fluids, such as water, ice chips, diluted fruit juice, and low-calorie sports drinks.  Eat bland, easy-to-digest foods in small amounts as you are able. These foods include bananas, applesauce, rice, lean meats, toast, and crackers.  Avoid drinking fluids that contain a lot of sugar or caffeine, such as energy drinks, sports drinks, and soda.  Avoid alcohol.  Avoid spicy or fatty foods. General instructions   Drink enough fluid to keep your urine clear or pale yellow.  Wash your hands often. If soap and water are not available, use hand sanitizer.  Make sure that all people in your household wash their hands well and often.  Take over-the-counter and prescription medicines only as told by your health care provider.  Rest at home while you recover.  Watch your condition for any changes.  Take a warm bath to relieve any burning or pain from frequent diarrhea episodes.  Keep all follow-up visits as told by your health care provider. This is important. Contact a health care provider if:  You have a fever.  Your diarrhea gets worse.  You have new symptoms.  You cannot keep fluids down.  You feel light-headed or  dizzy.  You have a headache  You have muscle cramps. Get help right away if:  You have chest pain.  You feel extremely weak or you faint.  You have bloody or black stools or stools that look like tar.  You have severe pain, cramping, or bloating in your abdomen.  You have trouble breathing or you are breathing very quickly.  Your heart is beating very quickly.  Your skin feels cold and clammy.  You feel confused.  You have signs of dehydration, such as:  Dark urine, very little urine, or no urine.  Cracked lips.  Dry mouth.  Sunken eyes.  Sleepiness.  Weakness. This information is not intended to replace advice given to you by your health care provider. Make sure you discuss any questions you have with your health care provider. Document Released: 11/08/2002 Document Revised: 03/28/2016 Document Reviewed: 07/25/2015 Elsevier Interactive Patient Education  2017 [employer].

## 2017-02-19 DIAGNOSIS — G4733 Obstructive sleep apnea (adult) (pediatric): Secondary | ICD-10-CM | POA: Diagnosis not present

## 2017-02-25 ENCOUNTER — Ambulatory Visit: Payer: BLUE CROSS/BLUE SHIELD | Admitting: General Surgery

## 2017-02-25 DIAGNOSIS — Z1231 Encounter for screening mammogram for malignant neoplasm of breast: Secondary | ICD-10-CM | POA: Diagnosis not present

## 2017-02-26 ENCOUNTER — Encounter: Payer: Self-pay | Admitting: General Surgery

## 2017-03-05 ENCOUNTER — Ambulatory Visit: Payer: BLUE CROSS/BLUE SHIELD | Admitting: General Surgery

## 2017-03-11 ENCOUNTER — Ambulatory Visit (INDEPENDENT_AMBULATORY_CARE_PROVIDER_SITE_OTHER): Payer: BLUE CROSS/BLUE SHIELD | Admitting: General Surgery

## 2017-03-11 ENCOUNTER — Encounter: Payer: Self-pay | Admitting: General Surgery

## 2017-03-11 VITALS — BP 130/74 | HR 78 | Resp 14 | Ht 63.5 in | Wt 184.0 lb

## 2017-03-11 DIAGNOSIS — M25531 Pain in right wrist: Secondary | ICD-10-CM

## 2017-03-11 DIAGNOSIS — Z86010 Personal history of colonic polyps: Secondary | ICD-10-CM

## 2017-03-11 DIAGNOSIS — N6019 Diffuse cystic mastopathy of unspecified breast: Secondary | ICD-10-CM | POA: Diagnosis not present

## 2017-03-11 NOTE — Progress Notes (Signed)
Patient ID: Theresa White, female   DOB: [birth year], 62 y.o.   MRN: [id number]  Chief Complaint  Patient presents with  . Follow-up    HPI Theresa White is a 61 y.o. female.  who presents for a breast evaluation. The most recent mammogram was done on 02-25-17 .  Patient does perform regular self breast checks and gets regular mammograms done.   No new breast issues. Family history is unchanged. She has a history of colon polyp, denies gi problems. She is wearing a right wrist splint. She is having numbness and pain at her thumb to elbow. Left hand is "good". Hurts to shake her hand.  HPI  Past Medical History:  Diagnosis Date  . Allergic rhinitis   . Bowel trouble 1995  . Chest pain   . Colon polyps   . Diffuse cystic mastopathy 2012  . GERD (gastroesophageal reflux disease)   . Medical history non-contributory   . Sleep apnea 2012  . Tension headache   . Tobacco abuse 04/12/2015    Past Surgical History:  Procedure Laterality Date  . ABDOMINAL EXPLORATION SURGERY    . ABDOMINAL HYSTERECTOMY  1996  . COLONOSCOPY  2010, 09/28/14   DR. Elliott  . FOOT SURGERY Left 2010  . TONSILLECTOMY AND ADENOIDECTOMY    . VAGINAL HYSTERECTOMY     Laparoscopically assisted with bilateral salpingo-oophorectomy, pelvic pain    Family History  Problem Relation Age of Onset  . Kidney failure Mother   . Stroke Mother   . Diabetes Mother   . Emphysema Father   . Hypertension Father   . Aneurysm Sister     Cerebral  . Cervical cancer Sister 67  . Bladder Cancer Brother 73    Social History Social History  Substance Use Topics  . Smoking status: Current Every Day Smoker    Packs/day: 1.00    Years: 30.00    Types: Cigarettes  . Smokeless tobacco: Never Used  . Alcohol use No    Allergies  Allergen Reactions  . Codeine Itching    Current Outpatient Prescriptions  Medication Sig Dispense Refill  . Ascorbic Acid (VITAMIN C PO) Take by mouth as directed.    Marland Kitchen aspirin 81 MG  tablet Take 81 mg by mouth daily.    Marland Kitchen CALCIUM PO Take by mouth daily.    . Cyanocobalamin (VITAMIN B-12 PO) Take by mouth daily.    Marland Kitchen DEXILANT 60 MG capsule     . loratadine (CLARITIN) 10 MG tablet Take 10 mg by mouth daily.    . montelukast (SINGULAIR) 10 MG tablet TAKE 1 TABLET DAILY 90 tablet 3  . Omega-3 Fatty Acids (FISH OIL PO) Take by mouth daily.    Marland Kitchen VITAMIN D, CHOLECALCIFEROL, PO Take 1 tablet by mouth daily.     No current facility-administered medications for this visit.     Review of Systems Review of Systems  Constitutional: Negative.   Respiratory: Negative.   Cardiovascular: Negative.     Blood pressure 130/74, pulse 78, resp. rate 14, height 5' 3.5" (1.613 m), weight 184 lb (83.5 kg).  Physical Exam Physical Exam  Constitutional: She is oriented to person, place, and time. She appears well-developed and well-nourished.  HENT:  Mouth/Throat: Oropharynx is clear and moist.  Eyes: Conjunctivae are normal. No scleral icterus.  Neck: Neck supple.  Cardiovascular: Normal rate, regular rhythm and normal heart sounds.   Pulmonary/Chest: Effort normal and breath sounds normal. Right breast exhibits no inverted nipple, no mass,  no nipple discharge, no skin change and no tenderness. Left breast exhibits no inverted nipple, no mass, no nipple discharge, no skin change and no tenderness.  Abdominal: Soft. There is no tenderness.  Lymphadenopathy:    She has no cervical adenopathy.    She has no axillary adenopathy.  Neurological: She is alert and oriented to person, place, and time.  Skin: Skin is warm and dry.  Psychiatric: Her behavior is normal.    Data Reviewed Mammogram reviewed and stable.   Assessment    Stable exam. FCD. History of colon polyps.  Right wrist pain.    Plan    She is to follow up in one year with bilateral screening mammograms with her PCP, Dr Caryn Section.  Colonoscopy due in 2020 by Dr Bary Castilla.  Recommend orthopedic surgeon, Dr Earnestine Leys, regarding her wrist.     HPI, Physical Exam, Assessment and Plan have been scribed under the direction and in the presence of Mckinley Jewel, MD  Karie Fetch, RN  The patient is scheduled to see Dr Earnestine Leys at [facility] on 03/17/17 at 9:15 am. She is aware of date and time.   Documented by Lesly Rubenstein LPN  I have completed the exam and reviewed the above documentation for accuracy and completeness.  I agree with the above.  Haematologist has been used and any errors in dictation or transcription are unintentional.  Seeplaputhur G. Jamal Collin, M.D., F.A.C.S.  Junie Panning G 03/17/2017, 10:37 AM

## 2017-03-11 NOTE — Patient Instructions (Addendum)
She is to follow up in one year with bilateral screening mammograms with her PCP, Dr Caryn Section.  Colonoscopy due in 2020 by Dr Bary Castilla.  Recommend orthopedic surgeon, Dr Earnestine Leys, regarding her wrist.  The patient is scheduled to see Dr Earnestine Leys at [facility] on 03/17/17 at 9:15 am. She is aware of date and time.

## 2017-03-17 DIAGNOSIS — G5603 Carpal tunnel syndrome, bilateral upper limbs: Secondary | ICD-10-CM | POA: Diagnosis not present

## 2017-03-17 DIAGNOSIS — M654 Radial styloid tenosynovitis [de Quervain]: Secondary | ICD-10-CM | POA: Diagnosis not present

## 2017-03-18 DIAGNOSIS — G5601 Carpal tunnel syndrome, right upper limb: Secondary | ICD-10-CM | POA: Diagnosis not present

## 2017-03-20 DIAGNOSIS — G5601 Carpal tunnel syndrome, right upper limb: Secondary | ICD-10-CM | POA: Diagnosis not present

## 2017-05-14 DIAGNOSIS — M654 Radial styloid tenosynovitis [de Quervain]: Secondary | ICD-10-CM | POA: Diagnosis not present

## 2017-05-22 DIAGNOSIS — G4733 Obstructive sleep apnea (adult) (pediatric): Secondary | ICD-10-CM | POA: Diagnosis not present

## 2017-06-10 ENCOUNTER — Encounter: Payer: Self-pay | Admitting: Family Medicine

## 2017-06-10 ENCOUNTER — Ambulatory Visit (INDEPENDENT_AMBULATORY_CARE_PROVIDER_SITE_OTHER): Payer: BLUE CROSS/BLUE SHIELD | Admitting: Family Medicine

## 2017-06-10 VITALS — BP 130/68 | HR 84 | Temp 98.3°F | Resp 16 | Ht 64.0 in | Wt 182.0 lb

## 2017-06-10 DIAGNOSIS — K219 Gastro-esophageal reflux disease without esophagitis: Secondary | ICD-10-CM | POA: Diagnosis not present

## 2017-06-10 DIAGNOSIS — Z0000 Encounter for general adult medical examination without abnormal findings: Secondary | ICD-10-CM

## 2017-06-10 DIAGNOSIS — K635 Polyp of colon: Secondary | ICD-10-CM

## 2017-06-10 DIAGNOSIS — Z720 Tobacco use: Secondary | ICD-10-CM

## 2017-06-10 MED ORDER — BUPROPION HCL ER (SR) 150 MG PO TB12: ORAL_TABLET | ORAL | 5 refills | Status: DC

## 2017-06-10 NOTE — Patient Instructions (Addendum)
Please bring copy of your most blood work to our office for your medical record   Bupropion sustained-release tablets (smoking cessation) What is this medicine? BUPROPION (byoo PROE pee on) is used to help people quit smoking. This medicine may be used for other purposes; ask your health care provider or pharmacist if you have questions. COMMON BRAND NAME(S): Buproban, Zyban What should I tell my health care provider before I take this medicine? They need to know if you have any of these conditions: -an eating disorder, such as anorexia or bulimia -bipolar disorder or psychosis -diabetes or high blood sugar, treated with medication -glaucoma -head injury or brain tumor -heart disease, previous heart attack, or irregular heart beat -high blood pressure -kidney or liver disease -seizures -suicidal thoughts or a previous suicide attempt -Tourette's syndrome -weight loss -an unusual or allergic reaction to bupropion, other medicines, foods, dyes, or preservatives -breast-feeding -pregnant or trying to become pregnant How should I use this medicine? Take this medicine by mouth with a glass of water. Follow the directions on the prescription label. You can take it with or without food. If it upsets your stomach, take it with food. Do not cut, crush or chew this medicine. Take your medicine at regular intervals. If you take this medicine more than once a day, take your second dose at least 8 hours after you take your first dose. To limit difficulty in sleeping, avoid taking this medicine at bedtime. Do not take your medicine more often than directed. Do not stop taking this medicine suddenly except upon the advice of your doctor. Stopping this medicine too quickly may cause serious side effects. A special MedGuide will be given to you by the pharmacist with each prescription and refill. Be sure to read this information carefully each time. Talk to your pediatrician regarding the use of this  medicine in children. Special care may be needed. Overdosage: If you think you have taken too much of this medicine contact a poison control center or emergency room at once. NOTE: This medicine is only for you. Do not share this medicine with others. What if I miss a dose? If you miss a dose, skip the missed dose and take your next tablet at the regular time. There should be at least 8 hours between doses. Do not take double or extra doses. What may interact with this medicine? Do not take this medicine with any of the following medications: -linezolid -MAOIs like Azilect, Carbex, Eldepryl, Marplan, Nardil, and Parnate -methylene blue (injected into a vein) -other medicines that contain bupropion like Wellbutrin This medicine may also interact with the following medications: -alcohol -certain medicines for anxiety or sleep -certain medicines for blood pressure like metoprolol, propranolol -certain medicines for depression or psychotic disturbances -certain medicines for HIV or AIDS like efavirenz, lopinavir, nelfinavir, ritonavir -certain medicines for irregular heart beat like propafenone, flecainide -certain medicines for Parkinson's disease like amantadine, levodopa -certain medicines for seizures like carbamazepine, phenytoin, phenobarbital -cimetidine -clopidogrel -cyclophosphamide -digoxin -furazolidone -isoniazid -nicotine -orphenadrine -procarbazine -steroid medicines like prednisone or cortisone -stimulant medicines for attention disorders, weight loss, or to stay awake -tamoxifen -theophylline -thiotepa -ticlopidine -tramadol -warfarin This list may not describe all possible interactions. Give your health care provider a list of all the medicines, herbs, non-prescription drugs, or dietary supplements you use. Also tell them if you smoke, drink alcohol, or use illegal drugs. Some items may interact with your medicine. What should I watch for while using this  medicine? Visit your doctor  or health care professional for regular checks on your progress. This medicine should be used together with a patient support program. It is important to participate in a behavioral program, counseling, or other support program that is recommended by your health care professional. Patients and their families should watch out for new or worsening thoughts of suicide or depression. Also watch out for sudden changes in feelings such as feeling anxious, agitated, panicky, irritable, hostile, aggressive, impulsive, severely restless, overly excited and hyperactive, or not being able to sleep. If this happens, especially at the beginning of treatment or after a change in dose, call your health care professional. Avoid alcoholic drinks while taking this medicine. Drinking excessive alcoholic beverages, using sleeping or anxiety medicines, or quickly stopping the use of these agents while taking this medicine may increase your risk for a seizure. Do not drive or use heavy machinery until you know how this medicine affects you. This medicine can impair your ability to perform these tasks. Do not take this medicine close to bedtime. It may prevent you from sleeping. Your mouth may get dry. Chewing sugarless gum or sucking hard candy, and drinking plenty of water may help. Contact your doctor if the problem does not go away or is severe. Do not use nicotine patches or chewing gum without the advice of your doctor or health care professional while taking this medicine. You may need to have your blood pressure taken regularly if your doctor recommends that you use both nicotine and this medicine together. What side effects may I notice from receiving this medicine? Side effects that you should report to your doctor or health care professional as soon as possible: -allergic reactions like skin rash, itching or hives, swelling of the face, lips, or tongue -breathing problems -changes in  vision -confusion -elevated mood, decreased need for sleep, racing thoughts, impulsive behavior -fast or irregular heartbeat -hallucinations, loss of contact with reality -increased blood pressure -redness, blistering, peeling or loosening of the skin, including inside the mouth -seizures -suicidal thoughts or other mood changes -unusually weak or tired -vomiting Side effects that usually do not require medical attention (report to your doctor or health care professional if they continue or are bothersome): -constipation -headache -loss of appetite -nausea -tremors -weight loss This list may not describe all possible side effects. Call your doctor for medical advice about side effects. You may report side effects to FDA at 1-800-FDA-1088. Where should I keep my medicine? Keep out of the reach of children. Store at room temperature between 20 and 25 degrees C (68 and 77 degrees F). Protect from light. Keep container tightly closed. Throw away any unused medicine after the expiration date. NOTE: This sheet is a summary. It may not cover all possible information. If you have questions about this medicine, talk to your doctor, pharmacist, or health care provider.  2018 Elsevier/Gold Standard (2016-05-10 13:49:28)

## 2017-06-10 NOTE — Progress Notes (Signed)
Patient: Theresa White, Female    DOB: 06-26-1956, 61 y.o.   MRN: [id number] Visit Date: 06/10/2017  Today's Provider: Lelon Huh, MD   Chief Complaint  Patient presents with  . Annual Exam   Subjective:    Annual physical exam Theresa White is a 61 y.o. female who presents today for health maintenance and complete physical. She feels well. She reports exercising active with daily activities. She reports she is sleeping well. 06/07/16 CPE 01/15/11 Pap-normal  1996       Total Hysterectomy  02/25/17 Mammogram-BI-RADS 1 (Dr. Jamal Collin) 09/28/14 Colonoscopy-diverticulosis, polyp, recheck in 5 years -----------------------------------------------------------------  Follow up for acid reflux  The patient was last seen for this 1 years ago. Changes made at last visit include no changes.  She reports excellent compliance with treatment. She feels that condition is Improved. She is not having side effects.  ------------------------------------------------------------------------------------  Follow up for tobacco abuse  The patient was last seen for this 1 years ago. Changes made at last visit include started on Wellbutrin.  She reports poor compliance with treatment. Patient reports she never started medication. Patient reports that she has retired and is smoking less now. Patient reports smoking about 15 cigarettes daily.  She feels that condition is Unchanged. She is not having side effects.  ------------------------------------------------------------------------------------  Review of Systems  Constitutional: Negative.   HENT: Negative.   Eyes: Negative.   Respiratory: Negative.   Cardiovascular: Negative.   Gastrointestinal: Negative.   Endocrine: Negative.   Genitourinary: Negative.   Musculoskeletal: Negative.   Skin: Negative.   Allergic/Immunologic: Negative.   Neurological: Negative.   Hematological: Negative.   Psychiatric/Behavioral: Negative.      Social History      She  reports that she has been smoking Cigarettes.  She has a 30.00 pack-year smoking history. She has never used smokeless tobacco. She reports that she does not drink alcohol or use drugs.       Social History   Social History  . Marital status: Married    Spouse name: N/A  . Number of children: 2  . Years of education: N/A   Occupational History  . Production planning    Social History Main Topics  . Smoking status: Current Every Day Smoker    Packs/day: 1.00    Years: 30.00    Types: Cigarettes  . Smokeless tobacco: Never Used  . Alcohol use No  . Drug use: No  . Sexual activity: Not Asked   Other Topics Concern  . None   Social History Narrative  . None    Past Medical History:  Diagnosis Date  . Allergic rhinitis   . Bowel trouble 1995  . Chest pain   . Colon polyps   . Diffuse cystic mastopathy 2012  . GERD (gastroesophageal reflux disease)   . Medical history non-contributory   . Sleep apnea 2012  . Tension headache   . Tobacco abuse 04/12/2015     Patient Active Problem List   Diagnosis Date Noted  . Diverticulitis of colon 03/18/2016  . Abnormal mammogram 03/18/2016  . Tobacco abuse 04/12/2015  . History of colonic polyps 09/20/2014  . Diffuse cystic mastopathy 02/17/2013  . Sleep apnea   . Allergic rhinitis 12/05/2009  . Post menopausal syndrome 12/05/2009  . Avitaminosis D 05/17/2009  . Anxiety disorder 11/01/2008  . Acid reflux 07/27/2007  . Headache, tension-type 07/27/2007  . Compulsive tobacco user syndrome 07/27/2007  . Cannot sleep 07/24/2007  Past Surgical History:  Procedure Laterality Date  . ABDOMINAL EXPLORATION SURGERY    . ABDOMINAL HYSTERECTOMY  1996  . COLONOSCOPY  2010, 09/28/14   DR. Elliott  . FOOT SURGERY Left 2010  . TONSILLECTOMY AND ADENOIDECTOMY    . VAGINAL HYSTERECTOMY     Laparoscopically assisted with bilateral salpingo-oophorectomy, pelvic pain    Family History         Family Status  Relation Status  . Mother Deceased at age 84  . Father Deceased at age 65  . Sister Deceased at age 44  . Sister Deceased at age 17  . Brother Alive  . Sister Alive  . Brother Alive        Her family history includes Aneurysm in her sister; Bladder Cancer (age of onset: 84) in her brother; Cervical cancer (age of onset: 35) in her sister; Diabetes in her mother; Emphysema in her father; Hypertension in her father; Kidney failure in her mother; Stroke in her mother.     Allergies  Allergen Reactions  . Codeine Itching     Current Outpatient Prescriptions:  .  Ascorbic Acid (VITAMIN C PO), Take by mouth as directed., Disp: , Rfl:  .  aspirin 81 MG tablet, Take 81 mg by mouth daily., Disp: , Rfl:  .  CALCIUM PO, Take by mouth daily., Disp: , Rfl:  .  Cyanocobalamin (VITAMIN B-12 PO), Take by mouth daily., Disp: , Rfl:  .  DEXILANT 60 MG capsule, , Disp: , Rfl:  .  loratadine (CLARITIN) 10 MG tablet, Take 10 mg by mouth daily., Disp: , Rfl:  .  montelukast (SINGULAIR) 10 MG tablet, TAKE 1 TABLET DAILY, Disp: 90 tablet, Rfl: 3 .  Omega-3 Fatty Acids (FISH OIL PO), Take by mouth daily., Disp: , Rfl:  .  VITAMIN D, CHOLECALCIFEROL, PO, Take 1 tablet by mouth daily., Disp: , Rfl:    Patient Care Team: Birdie Sons, MD as PCP - General (Family Medicine) Christene Lye, MD (General Surgery)      Objective:   Vitals: BP 130/68 (BP Location: Left Arm, Patient Position: Sitting, Cuff Size: Large)   Pulse 84   Temp 98.3 F (36.8 C) (Oral)   Resp 16   Ht 5\' 4"  (1.626 m)   Wt 182 lb (82.6 kg)   SpO2 98%   BMI 31.24 kg/m    Vitals:   06/10/17 1509  BP: 130/68  Pulse: 84  Resp: 16  Temp: 98.3 F (36.8 C)  TempSrc: Oral  SpO2: 98%  Weight: 182 lb (82.6 kg)  Height: 5\' 4"  (1.626 m)     Physical Exam   Depression Screen PHQ 2/9 Scores 06/10/2017 06/07/2016  PHQ - 2 Score 0 0  PHQ- 9 Score 0 1      Assessment & Plan:     Routine Health  Maintenance and Physical Exam  Exercise Activities and Dietary recommendations Goals    None       There is no immunization history on file for this patient.  Health Maintenance  Topic Date Due  . HIV Screening  01/04/1971  . TETANUS/TDAP  01/04/1975  . INFLUENZA VACCINE  07/02/2017  . MAMMOGRAM  02/26/2019  . COLONOSCOPY  09/28/2024  . Hepatitis C Screening  Completed     Discussed health benefits of physical activity, and encouraged her to engage in regular exercise appropriate for her age and condition.    --------------------------------------------------------------------  1. Annual physical exam Recommended Tdap which she declined and  Shingrix when available.   2. Polyp of colon, unspecified part of colon, unspecified type Continue follow up with surgery  3. Gastroesophageal reflux disease without esophagitis Well controlled.  Continue current medications.    4. Tobacco abuse > 30 pack years  - Ambulatory Referral for Lung Cancer Scre - buPROPion (WELLBUTRIN SR) 150 MG 12 hr tablet; 1 tablet daily for 3 days, then 1 tablet twice daily. Stop smoking 14 days after starting medication  Dispense: 60 tablet; Refill: [place], MD  [facility]

## 2017-06-12 ENCOUNTER — Telehealth: Payer: Self-pay | Admitting: *Deleted

## 2017-06-12 DIAGNOSIS — Z87891 Personal history of nicotine dependence: Secondary | ICD-10-CM

## 2017-06-12 NOTE — Telephone Encounter (Signed)
Notified patient that annual lung cancer screening low dose CT scan is due currently or will be in near future. Confirmed that patient is within the age range of 55-77, and asymptomatic, (no signs or symptoms of lung cancer). Patient denies illness that would prevent curative treatment for lung cancer if found. Verified smoking history, (current, 30.75 pack year). The shared decision making visit was done 04/14/15. Patient is agreeable for CT scan being scheduled.

## 2017-06-18 ENCOUNTER — Ambulatory Visit
Admission: RE | Admit: 2017-06-18 | Discharge: 2017-06-18 | Disposition: A | Discharge status: Home / Self Care | Payer: BLUE CROSS/BLUE SHIELD | Source: Ambulatory Visit | Attending: Oncology | Admitting: Oncology

## 2017-06-18 ENCOUNTER — Ambulatory Visit: Admission: RE | Admit: 2017-06-18 | Payer: BLUE CROSS/BLUE SHIELD | Source: Ambulatory Visit

## 2017-06-18 DIAGNOSIS — F17210 Nicotine dependence, cigarettes, uncomplicated: Secondary | ICD-10-CM | POA: Insufficient documentation

## 2017-06-18 DIAGNOSIS — Z122 Encounter for screening for malignant neoplasm of respiratory organs: Secondary | ICD-10-CM | POA: Diagnosis not present

## 2017-06-18 DIAGNOSIS — J439 Emphysema, unspecified: Secondary | ICD-10-CM | POA: Diagnosis not present

## 2017-06-18 DIAGNOSIS — I700 Atherosclerosis of aorta: Secondary | ICD-10-CM | POA: Diagnosis not present

## 2017-06-18 DIAGNOSIS — Z87891 Personal history of nicotine dependence: Secondary | ICD-10-CM

## 2017-06-20 ENCOUNTER — Encounter: Payer: Self-pay | Admitting: *Deleted

## 2017-06-25 DIAGNOSIS — L918 Other hypertrophic disorders of the skin: Secondary | ICD-10-CM | POA: Diagnosis not present

## 2017-06-25 DIAGNOSIS — B078 Other viral warts: Secondary | ICD-10-CM | POA: Diagnosis not present

## 2017-10-27 ENCOUNTER — Telehealth: Payer: Self-pay

## 2017-10-27 MED ORDER — METRONIDAZOLE 500 MG PO TABS: 500.0000 mg | ORAL_TABLET | Freq: Three times a day (TID) | ORAL | 0 refills | Status: DC

## 2017-10-27 MED ORDER — CIPROFLOXACIN HCL 500 MG PO TABS: 500.0000 mg | ORAL_TABLET | Freq: Two times a day (BID) | ORAL | 0 refills | Status: DC

## 2017-10-27 NOTE — Telephone Encounter (Signed)
Patient called and says that she has had watery diarrhea off and on for past two weeks after she eats. She started having abdominal pain on Saturday and is concerned about recurrent diverticulitis. She said her abdomin is sore to touch. No nausea or vomiting reported. She would like to see if she could have an antibiotic called in to [facility] on [address].

## 2017-10-27 NOTE — Telephone Encounter (Signed)
Spoke with Dr Jamal Collin and orders to call in Cipro 500 mg po BID for one week and Flagyl 500 mg po TID for one week. The patient will need to be seen in office afterwards. Patient aware and is scheduled to follow up in office.

## 2017-11-05 ENCOUNTER — Ambulatory Visit: Payer: BLUE CROSS/BLUE SHIELD | Admitting: General Surgery

## 2017-11-05 ENCOUNTER — Encounter: Payer: Self-pay | Admitting: General Surgery

## 2017-11-05 VITALS — BP 122/68 | HR 72 | Resp 12 | Ht 64.0 in | Wt 176.0 lb

## 2017-11-05 DIAGNOSIS — R1032 Left lower quadrant pain: Secondary | ICD-10-CM | POA: Diagnosis not present

## 2017-11-05 NOTE — Patient Instructions (Signed)
Patient to return as needed. The patient is aware to call back for any questions or concerns. 

## 2017-11-05 NOTE — Progress Notes (Signed)
Patient ID: Theresa White, female   DOB: 1956/08/05, 61 y.o.   MRN: [id number]  Chief Complaint  Patient presents with  . Follow-up    HPI Theresa White is a 61 y.o. female here today for her follow up diverticulitis and abdominal pain. Patient states she is doing much better from taking the Cipro last week. She was having pain in her lower left quadrant.Patient states the pain is almost resolved.  HPI  Past Medical History:  Diagnosis Date  . Bowel trouble 1995  . Diffuse cystic mastopathy 2012  . Tension headache     Past Surgical History:  Procedure Laterality Date  . ABDOMINAL EXPLORATION SURGERY    . COLONOSCOPY  2010, 09/28/14   DR. Elliott  . FOOT SURGERY Left 2010  . TONSILLECTOMY AND ADENOIDECTOMY    . TOTAL ABDOMINAL HYSTERECTOMY W/ BILATERAL SALPINGOOPHORECTOMY  1996   Laparoscopically assisted for pelvic pain    Family History  Problem Relation Age of Onset  . Kidney failure Mother   . Stroke Mother   . Diabetes Mother   . Emphysema Father   . Hypertension Father   . Aneurysm Sister        Cerebral  . Cervical cancer Sister 12  . Bladder Cancer Brother 18    Social History Social History   Tobacco Use  . Smoking status: Current Every Day Smoker    Types: Cigarettes  . Smokeless tobacco: Never Used  . Tobacco comment: Started as teenager, averaging 1 ppd since  Substance Use Topics  . Alcohol use: No    Alcohol/week: 0.0 oz  . Drug use: No    Allergies  Allergen Reactions  . Codeine Itching    Current Outpatient Medications  Medication Sig Dispense Refill  . Ascorbic Acid (VITAMIN C PO) Take by mouth as directed.    Marland Kitchen aspirin 81 MG tablet Take 81 mg by mouth daily.    Marland Kitchen CALCIUM PO Take by mouth daily.    . ciprofloxacin (CIPRO) 500 MG tablet Take 1 tablet (500 mg total) by mouth 2 (two) times daily. 14 tablet 0  . Cyanocobalamin (VITAMIN B-12 PO) Take by mouth daily.    Marland Kitchen DEXILANT 60 MG capsule     . loratadine (CLARITIN) 10 MG tablet Take  10 mg by mouth daily.    . metroNIDAZOLE (FLAGYL) 500 MG tablet Take 1 tablet (500 mg total) by mouth 3 (three) times daily. 21 tablet 0  . montelukast (SINGULAIR) 10 MG tablet TAKE 1 TABLET DAILY 90 tablet 3  . Omega-3 Fatty Acids (FISH OIL PO) Take by mouth daily.    Marland Kitchen VITAMIN D, CHOLECALCIFEROL, PO Take 1 tablet by mouth daily.    Marland Kitchen buPROPion (WELLBUTRIN SR) 150 MG 12 hr tablet 1 tablet daily for 3 days, then 1 tablet twice daily. Stop smoking 14 days after starting medication 60 tablet 5   No current facility-administered medications for this visit.     Review of Systems Review of Systems  Constitutional: Negative.   Respiratory: Negative.   Cardiovascular: Negative.     Blood pressure 122/68, pulse 72, resp. rate 12, height 5\' 4"  (1.626 m), weight 176 lb (79.8 kg).  Physical Exam Physical Exam  Constitutional: She is oriented to person, place, and time. She appears well-developed and well-nourished.  Cardiovascular: Normal rate, regular rhythm and normal heart sounds.  Pulmonary/Chest: Effort normal and breath sounds normal.  Abdominal: Soft. Normal appearance and bowel sounds are normal. There is tenderness in the left  lower quadrant. No hernia.    Neurological: She is alert and oriented to person, place, and time.  Skin: Skin is warm and dry.    Data Reviewed Prior notes reviewed  CT from last yr- diverticulosis, no diverticulitis Assessment    Left lower abdominal pain resolved, presumed diverticulitis.      Plan    Patient to return as needed. The patient is aware to call back for any questions or concerns.      HPI, Physical Exam, Assessment and Plan have been scribed under the direction and in the presence of Mckinley Jewel, MD  Gaspar Cola, CMA   I have completed the exam and reviewed the above documentation for accuracy and completeness.  I agree with the above.  Haematologist has been used and any errors in dictation or transcription are  unintentional.  Chaunce Winkels G. Jamal Collin, M.D., F.A.C.S.   Junie Panning G 11/14/2017, 11:09 AM

## 2017-11-24 IMAGING — CT CT CHEST LUNG CANCER SCREENING LOW DOSE W/O CM
1 of 3 series · 15 of 40 positions shown, 19 images · non-contrast
Comparison: Low-dose lung cancer screening CT chest dated
04/14/2015

CLINICAL DATA: 61-year-old female current smoker, with 31 pack-year
history of smoking, for follow-up lung cancer screening

EXAM:
CT CHEST WITHOUT CONTRAST LOW-DOSE FOR LUNG CANCER SCREENING
TECHNIQUE: Multidetector CT imaging of the chest was performed following the
standard protocol without IV contrast.

[Series 3: lungs · axial · 0.66mm/px · z∈[-585,-331]mm · 15 of 280 slices shown, 19 images]
[im 13/280  mediastinal]
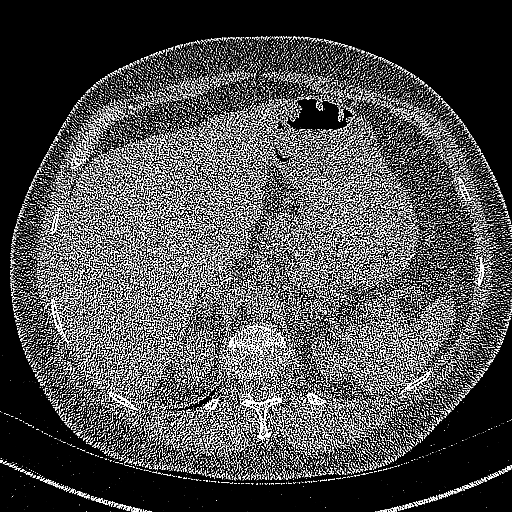
[im 13/280  lung]
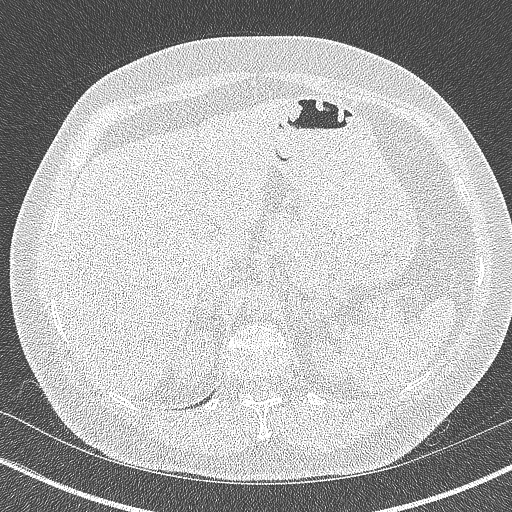
[im 39/280  lung]
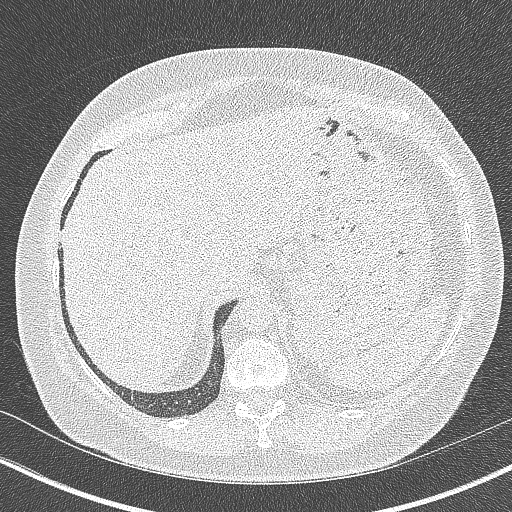
[im 64/280  lung]
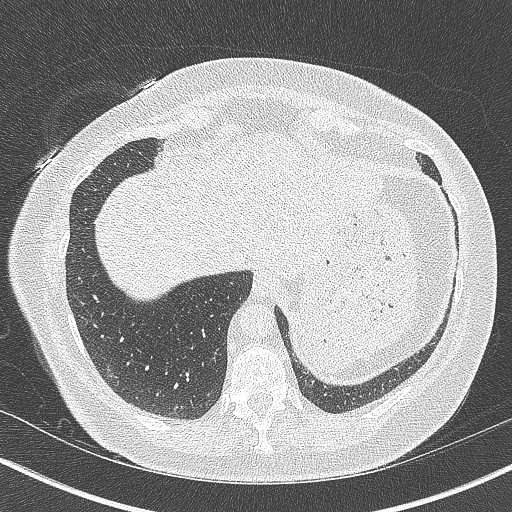
[im 77/280  lung]
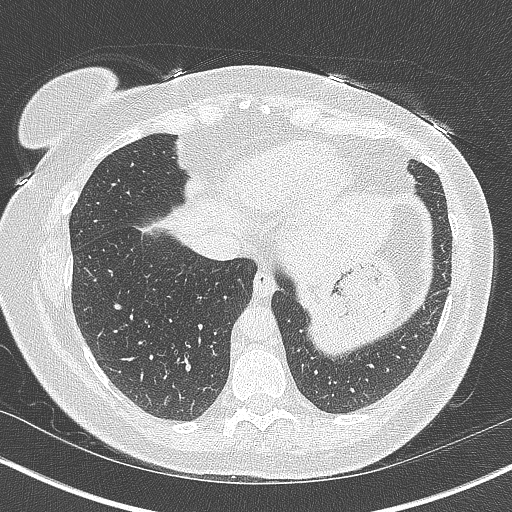
[im 94/280  mediastinal]
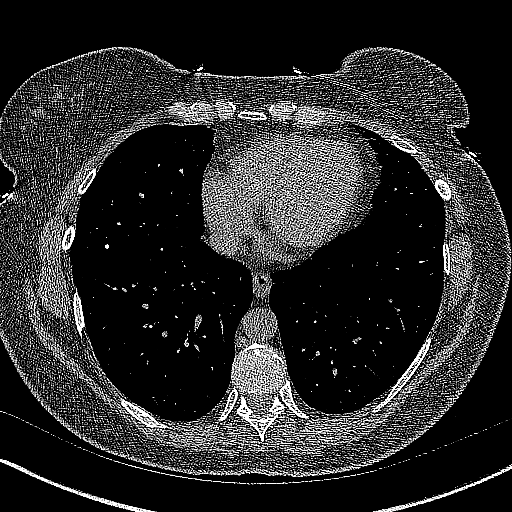
[im 94/280  lung]
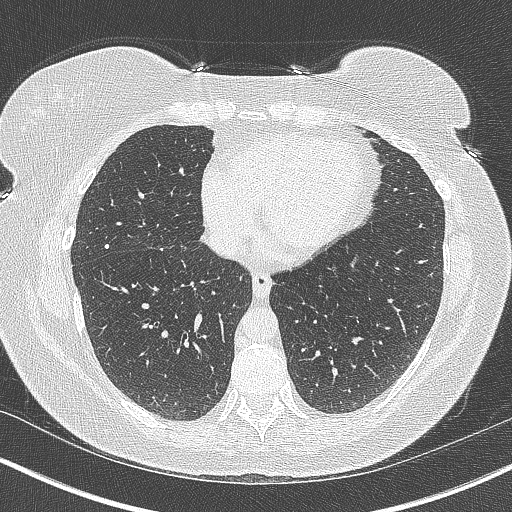
[im 102/280  lung]
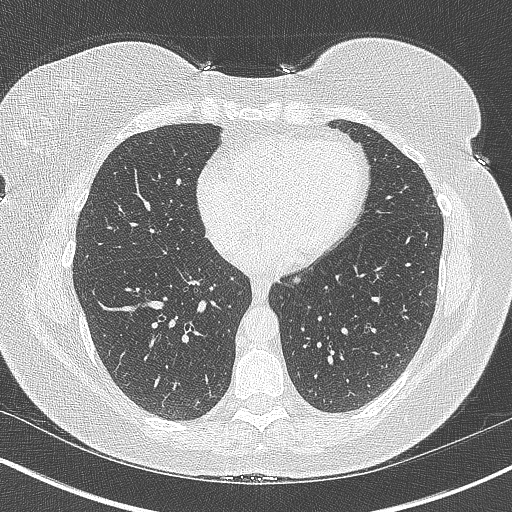
[im 127/280  lung]
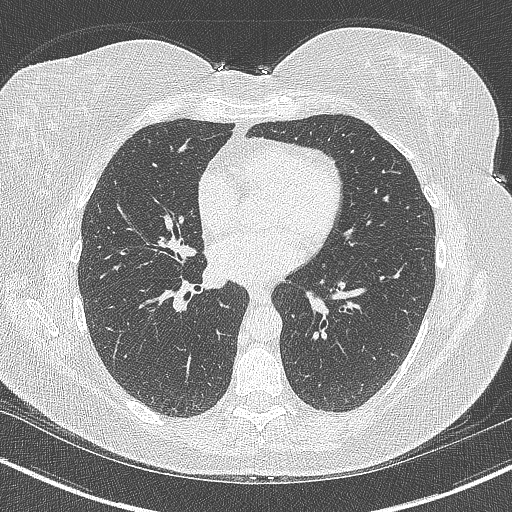
[im 140/280  lung]
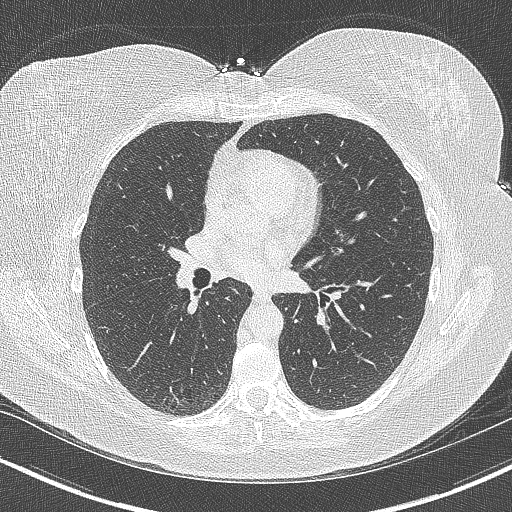
[im 153/280  mediastinal]
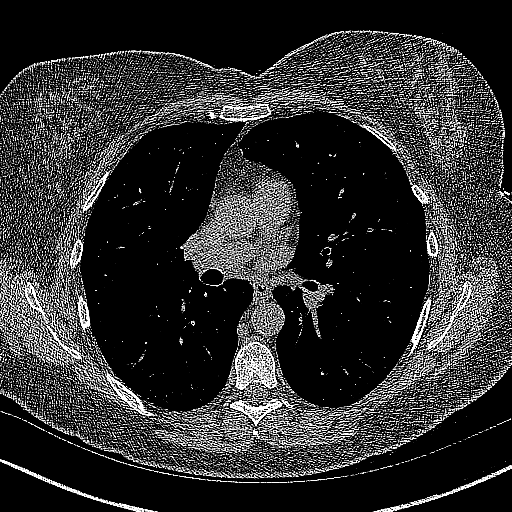
[im 153/280  lung]
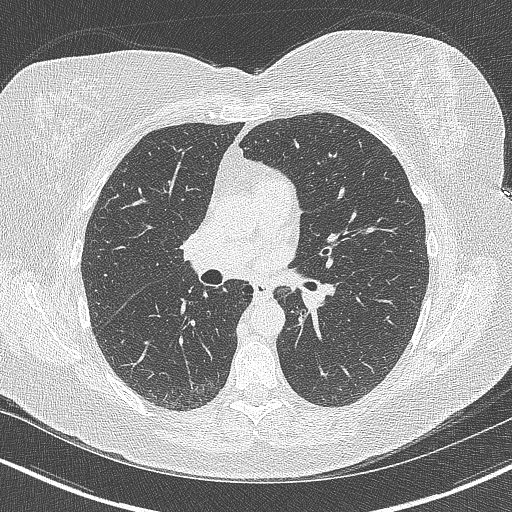
[im 178/280  lung]
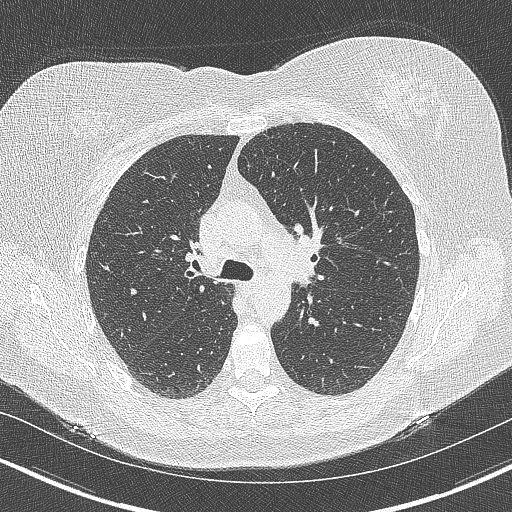
[im 187/280  lung]
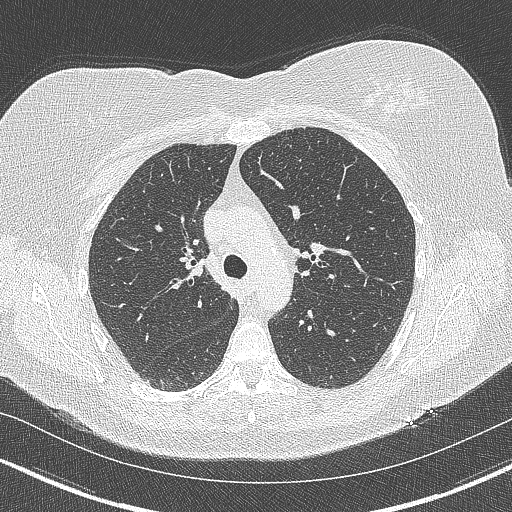
[im 203/280  lung]
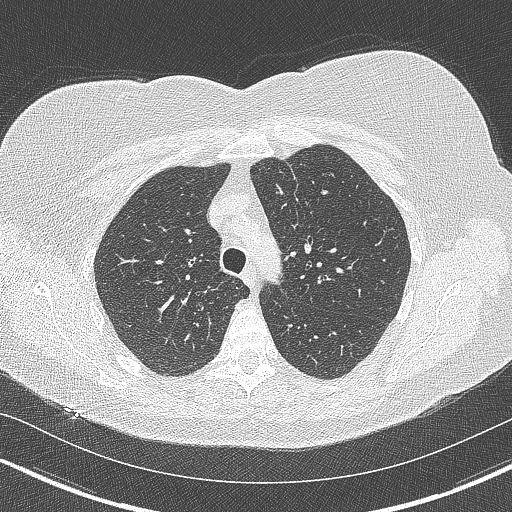
[im 229/280  mediastinal]
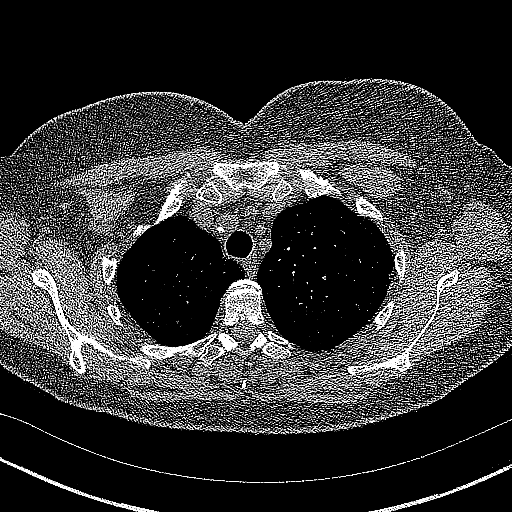
[im 229/280  lung]
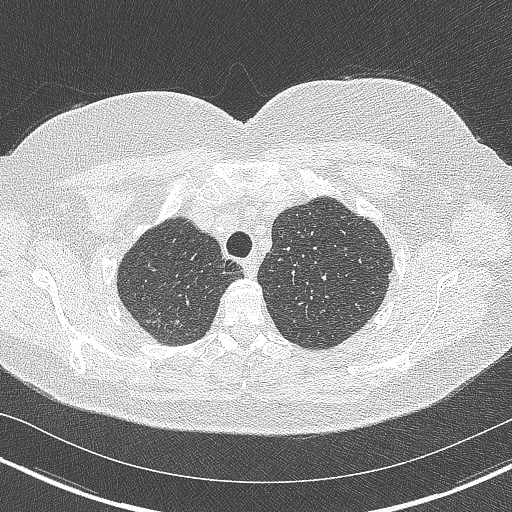
[im 241/280  lung]
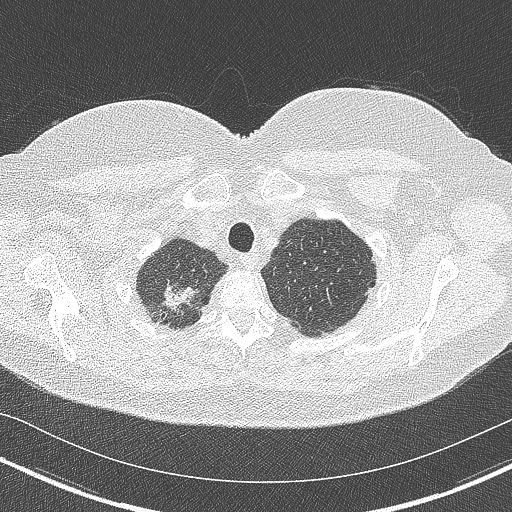
[im 267/280  lung]
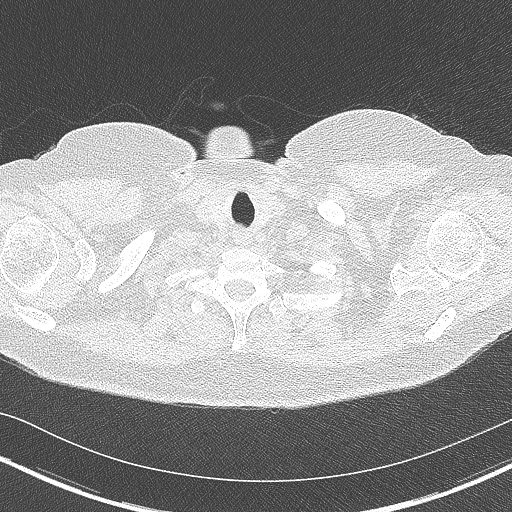

[15 of 40 positions shown; findings below may reference images not displayed]

FINDINGS: Cardiovascular: Heart is normal in size.  No pericardial effusion.

No evidence of thoracic aortic aneurysm. Mild atherosclerotic
calcifications aortic arch.

Mediastinum/Nodes: Small mediastinal lymph nodes which do not meet
pathologic CT size criteria.

Visualized thyroid is unremarkable.

Lungs/Pleura: Biapical pleural-parenchymal scarring, right greater
than left, unchanged.

No focal consolidation.

Mild paraseptal emphysematous changes, upper lobe predominant.

No pleural effusion or pneumothorax.

Upper Abdomen: Visualized upper abdomen is unremarkable.

Musculoskeletal: Mild degenerative changes of the visualized
thoracolumbar spine.
IMPRESSION: Lung-RADS 1, negative. Continue annual screening with low-dose chest
CT without contrast in 12 months.

Aortic Atherosclerosis (5GCKZ-QLX.X) and Emphysema (5GCKZ-FUJ.T).

## 2017-11-27 DIAGNOSIS — M654 Radial styloid tenosynovitis [de Quervain]: Secondary | ICD-10-CM | POA: Diagnosis not present

## 2017-11-28 DIAGNOSIS — G4733 Obstructive sleep apnea (adult) (pediatric): Secondary | ICD-10-CM | POA: Diagnosis not present

## 2017-12-15 ENCOUNTER — Other Ambulatory Visit: Payer: Self-pay | Admitting: Family Medicine

## 2017-12-15 DIAGNOSIS — F17210 Nicotine dependence, cigarettes, uncomplicated: Secondary | ICD-10-CM

## 2018-02-26 DIAGNOSIS — Z1231 Encounter for screening mammogram for malignant neoplasm of breast: Secondary | ICD-10-CM | POA: Diagnosis not present

## 2018-02-26 LAB — HM MAMMOGRAPHY

## 2018-03-03 ENCOUNTER — Encounter: Payer: Self-pay | Admitting: *Deleted

## 2018-03-06 DIAGNOSIS — G4733 Obstructive sleep apnea (adult) (pediatric): Secondary | ICD-10-CM | POA: Diagnosis not present

## 2018-05-07 DIAGNOSIS — M1711 Unilateral primary osteoarthritis, right knee: Secondary | ICD-10-CM | POA: Diagnosis not present

## 2018-05-25 ENCOUNTER — Telehealth: Payer: Self-pay | Admitting: *Deleted

## 2018-05-25 ENCOUNTER — Ambulatory Visit (INDEPENDENT_AMBULATORY_CARE_PROVIDER_SITE_OTHER): Payer: BLUE CROSS/BLUE SHIELD | Admitting: Family Medicine

## 2018-05-25 ENCOUNTER — Encounter: Payer: Self-pay | Admitting: Family Medicine

## 2018-05-25 VITALS — BP 122/82 | HR 77 | Temp 98.3°F | Resp 16 | Wt 172.8 lb

## 2018-05-25 DIAGNOSIS — K5790 Diverticulosis of intestine, part unspecified, without perforation or abscess without bleeding: Secondary | ICD-10-CM | POA: Diagnosis not present

## 2018-05-25 DIAGNOSIS — R1030 Lower abdominal pain, unspecified: Secondary | ICD-10-CM | POA: Diagnosis not present

## 2018-05-25 DIAGNOSIS — R252 Cramp and spasm: Secondary | ICD-10-CM | POA: Diagnosis not present

## 2018-05-25 MED ORDER — CYCLOBENZAPRINE HCL 5 MG PO TABS: 5.0000 mg | ORAL_TABLET | Freq: Three times a day (TID) | ORAL | 0 refills | Status: DC | PRN

## 2018-05-25 MED ORDER — CIPROFLOXACIN HCL 500 MG PO TABS: 500.0000 mg | ORAL_TABLET | Freq: Two times a day (BID) | ORAL | 0 refills | Status: DC

## 2018-05-25 MED ORDER — METRONIDAZOLE 500 MG PO TABS: 500.0000 mg | ORAL_TABLET | Freq: Three times a day (TID) | ORAL | 0 refills | Status: DC

## 2018-05-25 NOTE — Patient Instructions (Signed)
We will call you with the lab results. Let us know if new symptoms or not improving.

## 2018-05-25 NOTE — Telephone Encounter (Signed)
Will need office visit. Not willing to treat this over the phone.

## 2018-05-25 NOTE — Progress Notes (Signed)
  Subjective:     Patient ID: Theresa White, female   DOB: [birth year], 62 y.o.   MRN: [id number] Chief Complaint  Patient presents with  . Abdominal Pain    Patient comes in office today with complaints of abdominal pain for the past 5 days. Patient states that over weeked she ran a fever high of 100.4 and had symptoms of nausea and mucous like diarrhea. Patient describes pain in abdomen as feeling bruised.  . Leg Pain    Patient complains of pain in her right calf for the past two days, patient decribes pain as cramping and states that she has difficulty walking.   HPI States she had pain with her bowel movement yesterday but feels a bit improved today. Denies injuring her leg. Hx of descending/sigmoid diverticulosis with prior diverticulitis.  Review of Systems     Objective:   Physical Exam  Constitutional: She appears well-developed and well-nourished. She does not appear ill. No distress.  Cardiovascular:  Pulses:      Dorsalis pedis pulses are 2+ on the right side.       Posterior tibial pulses are 2+ on the right side.  Abdominal: Bowel sounds are normal. There is tenderness ( bilateral lower quadrant ).  Musculoskeletal:  Right calf tender without erythema or distal edema       Assessment:    1. Lower abdominal pain: will cover for diverticulitis - ciprofloxacin (CIPRO) 500 MG tablet; Take 1 tablet (500 mg total) by mouth 2 (two) times daily.  Dispense: 14 tablet; Refill: 0 - metroNIDAZOLE (FLAGYL) 500 MG tablet; Take 1 tablet (500 mg total) by mouth 3 (three) times daily.  Dispense: 21 tablet; Refill: 0  2. Diverticulosis  3. Leg cramps - Magnesium - Renal function panel - cyclobenzaprine (FLEXERIL) 5 MG tablet; Take 1 tablet (5 mg total) by mouth 3 (three) times daily as needed for muscle spasms.  Dispense: 21 tablet; Refill: 0    Plan:    Further f/u pending lab results.

## 2018-05-25 NOTE — Telephone Encounter (Signed)
Patient called office stating her diverticulitis has flared up over the weekend and she wanted to know if one of the providers would send her in a medication. Patient is aware Dr. Caryn Section is out this week. Patient states she has seen Mikki Santee in the past for this problem and requested message be sent to him. Please advise?

## 2018-05-25 NOTE — Telephone Encounter (Signed)
appt arranged for this afternoon. KW

## 2018-05-25 NOTE — Telephone Encounter (Signed)
Would you like for me to have patient comes in office to evaluate symptoms? KW

## 2018-05-26 ENCOUNTER — Telehealth: Payer: Self-pay

## 2018-05-26 LAB — RENAL FUNCTION PANEL
Albumin: 4.2 g/dL (ref 3.6–4.8)
BUN/Creatinine Ratio: 16 (ref 12–28)
BUN: 14 mg/dL (ref 8–27)
CALCIUM: 9.1 mg/dL (ref 8.7–10.3)
CO2: 20 mmol/L (ref 20–29)
CREATININE: 0.89 mg/dL (ref 0.57–1.00)
Chloride: 104 mmol/L (ref 96–106)
GFR calc Af Amer: 80 mL/min/{1.73_m2} (ref >59)
GFR, EST NON AFRICAN AMERICAN: 70 mL/min/{1.73_m2} (ref >59)
Glucose: 101 mg/dL — ABNORMAL HIGH (ref 65–99)
PHOSPHORUS: 4 mg/dL (ref 2.5–4.5)
Potassium: 4.2 mmol/L (ref 3.5–5.2)
Sodium: 140 mmol/L (ref 134–144)

## 2018-05-26 LAB — MAGNESIUM: Magnesium: 1.9 mg/dL (ref 1.6–2.3)

## 2018-05-26 NOTE — Telephone Encounter (Signed)
Patient reports the muscle relaxant has not helped much but she has been applying heat with some improvement. Patient states she will call you back in 2 days with an update on her progress. KW

## 2018-05-26 NOTE — Telephone Encounter (Signed)
-----   Message from Carmon Ginsberg, [place] sent at 05/26/2018  7:26 AM EDT ----- Labs ok. Do the muscle relaxants help you calf cramps?

## 2018-06-09 ENCOUNTER — Telehealth: Payer: Self-pay | Admitting: Nurse Practitioner

## 2018-06-10 ENCOUNTER — Telehealth: Payer: Self-pay | Admitting: *Deleted

## 2018-06-10 DIAGNOSIS — Z122 Encounter for screening for malignant neoplasm of respiratory organs: Secondary | ICD-10-CM

## 2018-06-10 DIAGNOSIS — Z87891 Personal history of nicotine dependence: Secondary | ICD-10-CM

## 2018-06-10 NOTE — Telephone Encounter (Signed)
Notified patient that lung cancer screening CT scan is due currently or will be in near future. Confirmed that patient is within the age range of 55-77, and asymptomatic, (no signs or symptoms of lung cancer). Patient denies illness that would prevent curative treatment for lung cancer if found. Verified smoking history, (current, 31.75 pack year). The shared decision making visit was done 04/14/15. Patient is agreeable for CT scan being scheduled.

## 2018-06-11 ENCOUNTER — Ambulatory Visit (INDEPENDENT_AMBULATORY_CARE_PROVIDER_SITE_OTHER): Payer: BLUE CROSS/BLUE SHIELD | Admitting: Family Medicine

## 2018-06-11 ENCOUNTER — Encounter: Payer: Self-pay | Admitting: Family Medicine

## 2018-06-11 VITALS — BP 108/64 | HR 92 | Temp 98.9°F | Resp 16 | Ht 64.0 in | Wt 175.0 lb

## 2018-06-11 DIAGNOSIS — F17210 Nicotine dependence, cigarettes, uncomplicated: Secondary | ICD-10-CM

## 2018-06-11 DIAGNOSIS — K219 Gastro-esophageal reflux disease without esophagitis: Secondary | ICD-10-CM | POA: Diagnosis not present

## 2018-06-11 DIAGNOSIS — J309 Allergic rhinitis, unspecified: Secondary | ICD-10-CM

## 2018-06-11 DIAGNOSIS — E559 Vitamin D deficiency, unspecified: Secondary | ICD-10-CM | POA: Diagnosis not present

## 2018-06-11 DIAGNOSIS — Z0001 Encounter for general adult medical examination with abnormal findings: Secondary | ICD-10-CM

## 2018-06-11 DIAGNOSIS — Z0000 Encounter for general adult medical examination without abnormal findings: Secondary | ICD-10-CM

## 2018-06-11 MED ORDER — VARENICLINE TARTRATE 1 MG PO TABS: 1.0000 mg | ORAL_TABLET | Freq: Two times a day (BID) | ORAL | 2 refills | Status: AC

## 2018-06-11 MED ORDER — VARENICLINE TARTRATE 0.5 MG X 11 & 1 MG X 42 PO MISC: ORAL | 0 refills | Status: AC

## 2018-06-11 NOTE — Patient Instructions (Signed)
   The CDC recommends two doses of Shingrix (the shingles vaccine) separated by 2 to 6 months for adults age 62 years and older. I recommend checking with your insurance plan regarding coverage for this vaccine.     You are also due for a tetanus vaccine. Please check with your insurance plan regarding coverage for this vaccine.    Please stop smoking   It is recommended to engage in 150 minutes of moderate exercise every week.

## 2018-06-11 NOTE — Progress Notes (Signed)
Patient: Theresa White, Female    DOB: 09-Jan-1956, 62 y.o.   MRN: [id number] Visit Date: 06/11/2018  Today's Provider: Lelon Huh, MD   Chief Complaint  Patient presents with  . Annual Exam  . Gastroesophageal Reflux   Subjective:    Annual physical exam Theresa White is a 62 y.o. female who presents today for health maintenance and complete physical. She feels well. She reports no regular exercising. She reports she is sleeping well.  ----------------------------------------------------------------- Follow up of GERD: Patient was last seen for this problem 1 year ago and no changes were made. Patient reports this problem is stable on current medication.   Follow up of Leg Cramps: Patient was last seen for this problem 3 weeks ago by Carmon Ginsberg PA-C. Management during that visit includes starting Flexeril. Patient reports this problem has resolved.   Tobacco Abuse: Patient was last seen for this problem 1 year ago. Management during that visit includes starting Bupropion. Today patient reports she smokes 1 ppd. She states the Bupropion did not help.  Review of Systems  Constitutional: Negative for chills, fatigue and fever.  HENT: Negative for congestion, ear pain, rhinorrhea, sneezing and sore throat.   Eyes: Negative.  Negative for pain and redness.  Respiratory: Negative for cough, shortness of breath and wheezing.   Cardiovascular: Negative for chest pain and leg swelling.  Gastrointestinal: Negative for abdominal pain, blood in stool, constipation, diarrhea and nausea.  Endocrine: Negative for polydipsia and polyphagia.  Genitourinary: Negative.  Negative for dysuria, flank pain, hematuria, pelvic pain, vaginal bleeding and vaginal discharge.  Musculoskeletal: Negative for arthralgias, back pain, gait problem and joint swelling.  Skin: Negative for rash.  Neurological: Negative.  Negative for dizziness, tremors, seizures, weakness, light-headedness, numbness  and headaches.  Hematological: Negative for adenopathy.  Psychiatric/Behavioral: Negative.  Negative for behavioral problems, confusion and dysphoric mood. The patient is not nervous/anxious and is not hyperactive.     Social History      She  reports that she has been smoking cigarettes.  She has been smoking about 1.00 pack per day. She has never used smokeless tobacco. She reports that she does not drink alcohol or use drugs.       Social History   Socioeconomic History  . Marital status: Married    Spouse name: Not on file  . Number of children: 2  . Years of education: Not on file  . Highest education level: Not on file  Occupational History  . Occupation: Museum/gallery conservator  Social Needs  . Financial resource strain: Not on file  . Food insecurity:    Worry: Not on file    Inability: Not on file  . Transportation needs:    Medical: Not on file    Non-medical: Not on file  Tobacco Use  . Smoking status: Current Every Day Smoker    Packs/day: 1.00    Types: Cigarettes  . Smokeless tobacco: Never Used  . Tobacco comment: Started as teenager, averaging 1 ppd since  Substance and Sexual Activity  . Alcohol use: No    Alcohol/week: 0.0 oz  . Drug use: No  . Sexual activity: Not on file  Lifestyle  . Physical activity:    Days per week: Not on file    Minutes per session: Not on file  . Stress: Not on file  Relationships  . Social connections:    Talks on phone: Not on file    Gets together: Not  on file    Attends religious service: Not on file    Active member of club or organization: Not on file    Attends meetings of clubs or organizations: Not on file    Relationship status: Not on file  Other Topics Concern  . Not on file  Social History Narrative  . Not on file    Past Medical History:  Diagnosis Date  . Bowel trouble 1995  . Diffuse cystic mastopathy 2012  . Tension headache      Patient Active Problem List   Diagnosis Date Noted  . Colon  polyps   . GERD (gastroesophageal reflux disease)   . Diverticulitis of colon 03/18/2016  . Abnormal mammogram 03/18/2016  . Smoking greater than 30 pack years 04/12/2015  . History of colonic polyps 09/20/2014  . Diffuse cystic mastopathy 02/17/2013  . Sleep apnea   . Allergic rhinitis 12/05/2009  . Post menopausal syndrome 12/05/2009  . Avitaminosis D 05/17/2009  . Anxiety disorder 11/01/2008  . Acid reflux 07/27/2007  . Headache, tension-type 07/27/2007  . Compulsive tobacco user syndrome 07/27/2007  . Cannot sleep 07/24/2007    Past Surgical History:  Procedure Laterality Date  . ABDOMINAL EXPLORATION SURGERY    . COLONOSCOPY  2010, 09/28/14   DR. Elliott  . FOOT SURGERY Left 2010  . TONSILLECTOMY AND ADENOIDECTOMY    . TOTAL ABDOMINAL HYSTERECTOMY W/ BILATERAL SALPINGOOPHORECTOMY  1996   Laparoscopically assisted for pelvic pain    Family History        Family Status  Relation Name Status  . Mother  Deceased at age 70  . Father  Deceased at age 72  . Sister  Deceased at age 33  . Sister  Deceased at age 68  . Brother  Alive  . Sister  Alive  . Brother  Alive        Her family history includes Aneurysm in her sister; Bladder Cancer (age of onset: 76) in her brother; Cervical cancer (age of onset: 52) in her sister; Diabetes in her mother; Emphysema in her father; Hypertension in her father; Kidney failure in her mother; Stroke in her mother.      Allergies  Allergen Reactions  . Codeine Itching     Current Outpatient Medications:  .  Ascorbic Acid (VITAMIN C PO), Take by mouth as directed., Disp: , Rfl:  .  aspirin 81 MG tablet, Take 81 mg by mouth daily., Disp: , Rfl:  .  CALCIUM PO, Take by mouth daily., Disp: , Rfl:  .  Cyanocobalamin (VITAMIN B-12 PO), Take by mouth daily., Disp: , Rfl:  .  DEXILANT 60 MG capsule, , Disp: , Rfl:  .  loratadine (CLARITIN) 10 MG tablet, Take 10 mg by mouth daily., Disp: , Rfl:  .  meloxicam (MOBIC) 15 MG tablet, TAKE 1  TABLET BY MOUTH ONCE DAILY WITH MEALS, Disp: , Rfl: 3 .  montelukast (SINGULAIR) 10 MG tablet, TAKE 1 TABLET DAILY, Disp: 90 tablet, Rfl: 3 .  Omega-3 Fatty Acids (FISH OIL PO), Take by mouth daily., Disp: , Rfl:  .  cyclobenzaprine (FLEXERIL) 5 MG tablet, Take 1 tablet (5 mg total) by mouth 3 (three) times daily as needed for muscle spasms. (Patient not taking: Reported on 06/11/2018), Disp: 21 tablet, Rfl: 0 .  VITAMIN D, CHOLECALCIFEROL, PO, Take 1 tablet by mouth daily., Disp: , Rfl:   (not taking)  Patient Care Team: Birdie Sons, MD as PCP - General (Family Medicine) Christene Lye,  MD (General Surgery) Carloyn Manner, MD as Referring Physician (Otolaryngology) Earnestine Leys, MD (Orthopedic Surgery)      Objective:   Vitals: BP 108/64 (BP Location: Left Arm, Patient Position: Sitting, Cuff Size: Large)   Pulse 92   Temp 98.9 F (37.2 C) (Oral)   Resp 16   Ht 5\' 4"  (1.626 m)   Wt 175 lb (79.4 kg)   SpO2 97% Comment: room air  BMI 30.04 kg/m    Vitals:   06/11/18 1003  BP: 108/64  Pulse: 92  Resp: 16  Temp: 98.9 F (37.2 C)  TempSrc: Oral  SpO2: 97%  Weight: 175 lb (79.4 kg)  Height: 5\' 4"  (1.626 m)     Physical Exam  General Appearance:    Alert, cooperative, no distress, appears stated age  Head:    Normocephalic, without obvious abnormality, atraumatic  Eyes:    PERRL, conjunctiva/corneas clear, EOM's intact, fundi    benign, both eyes  Ears:    Normal TM's and external ear canals, both ears  Nose:   Nares normal, septum midline, mucosa normal, no drainage    or sinus tenderness  Throat:   Lips, mucosa, and tongue normal; teeth and gums normal  Neck:   Supple, symmetrical, trachea midline, no adenopathy;    thyroid:  no enlargement/tenderness/nodules; no carotid   bruit or JVD  Back:     Symmetric, no curvature, ROM normal, no CVA tenderness  Lungs:     Clear to auscultation bilaterally, respirations unlabored  Chest Wall:    No tenderness  or deformity   Heart:    Regular rate and rhythm, S1 and S2 normal, no murmur, rub   or gallop  Breast Exam:    normal appearance, no masses or tenderness  Abdomen:     Soft, non-tender, bowel sounds active all four quadrants,    no masses, no organomegaly  Pelvic:    deferred  Extremities:   Extremities normal, atraumatic, no cyanosis or edema  Pulses:   2+ and symmetric all extremities  Skin:   Skin color, texture, turgor normal, no rashes or lesions  Lymph nodes:   Cervical, supraclavicular, and axillary nodes normal  Neurologic:   CNII-XII intact, normal strength, sensation and reflexes    throughout    Depression Screen PHQ 2/9 Scores 06/11/2018 06/10/2017 06/10/2017 06/07/2016  PHQ - 2 Score 0 0 0 0  PHQ- 9 Score 0 0 0 1      Assessment & Plan:     Routine Health Maintenance and Physical Exam  Exercise Activities and Dietary recommendations Goals    None       There is no immunization history on file for this patient.  Health Maintenance  Topic Date Due  . HIV Screening  01/04/1971  . TETANUS/TDAP  01/04/1975  . INFLUENZA VACCINE  07/02/2018  . MAMMOGRAM  02/27/2019  . COLONOSCOPY  09/29/2019  . Hepatitis C Screening  Completed     Discussed health benefits of physical activity, and encouraged her to engage in regular exercise appropriate for her age and condition.    --------------------------------------------------------------------  1. Annual physical exam Mildly obese, otherwise normal exam.  - CBC - Lipid panel - VITAMIN D 25 Hydroxy (Vit-D Deficiency, Fractures)  2. Smoking greater than 30 pack years She has contact number to arrange LDCT. She did not tolerate bupropion in the past but is willing to try Chantix.  - varenicline (CHANTIX CONTINUING MONTH PAK) 1 MG tablet; Take 1 tablet (1 mg total)  by mouth 2 (two) times daily.  Dispense: 60 tablet; Refill: 2 - varenicline (CHANTIX STARTING MONTH PAK) 0.5 MG X 11 & 1 MG X 42 tablet; Take one 0.5 mg  tablet daily for 3 days, then take one 0.5 mg tablet twice daily for 4 days, then take one 1 mg tablet twice daily  Dispense: 53 tablet; Refill: 0  3. Gastroesophageal reflux disease, esophagitis presence not specified Doing well with Dexilant which is prescribed by ENT  4. Avitaminosis D Not currently taking supplement.  - VITAMIN D 25 Hydroxy (Vit-D Deficiency, Fractures)  5. Allergic rhinitis, unspecified seasonality, unspecified trigger Doing well with montelukast and loratadine which she takes year around.   Counseled on recommendations for Shingrix and Td vaccine and to check with her pharmacy regarding coverage.  Is up to date on mammogram.    Lelon Huh, MD  [place]

## 2018-06-17 DIAGNOSIS — E559 Vitamin D deficiency, unspecified: Secondary | ICD-10-CM | POA: Diagnosis not present

## 2018-06-17 DIAGNOSIS — Z0000 Encounter for general adult medical examination without abnormal findings: Secondary | ICD-10-CM | POA: Diagnosis not present

## 2018-06-18 ENCOUNTER — Ambulatory Visit
Admission: RE | Admit: 2018-06-18 | Discharge: 2018-06-18 | Disposition: A | Discharge status: Home / Self Care | Payer: BLUE CROSS/BLUE SHIELD | Source: Ambulatory Visit | Attending: Oncology | Admitting: Oncology

## 2018-06-18 DIAGNOSIS — J439 Emphysema, unspecified: Secondary | ICD-10-CM | POA: Insufficient documentation

## 2018-06-18 DIAGNOSIS — I700 Atherosclerosis of aorta: Secondary | ICD-10-CM | POA: Diagnosis not present

## 2018-06-18 DIAGNOSIS — Z122 Encounter for screening for malignant neoplasm of respiratory organs: Secondary | ICD-10-CM

## 2018-06-18 DIAGNOSIS — Z87891 Personal history of nicotine dependence: Secondary | ICD-10-CM | POA: Diagnosis not present

## 2018-06-18 DIAGNOSIS — F17210 Nicotine dependence, cigarettes, uncomplicated: Secondary | ICD-10-CM | POA: Diagnosis not present

## 2018-06-18 LAB — CBC
HEMATOCRIT: 42.5 % (ref 34.0–46.6)
HEMOGLOBIN: 14.4 g/dL (ref 11.1–15.9)
MCH: 32.6 pg (ref 26.6–33.0)
MCHC: 33.9 g/dL (ref 31.5–35.7)
MCV: 96 fL (ref 79–97)
Platelets: 271 10*3/uL (ref 150–450)
RBC: 4.42 x10E6/uL (ref 3.77–5.28)
RDW: 13.5 % (ref 12.3–15.4)
WBC: 6.3 10*3/uL (ref 3.4–10.8)

## 2018-06-18 LAB — LIPID PANEL
CHOL/HDL RATIO: 2.5 ratio (ref 0.0–4.4)
Cholesterol, Total: 122 mg/dL (ref 100–199)
HDL: 48 mg/dL (ref >39)
LDL CALC: 43 mg/dL (ref 0–99)
TRIGLYCERIDES: 154 mg/dL — AB (ref 0–149)
VLDL Cholesterol Cal: 31 mg/dL (ref 5–40)

## 2018-06-18 LAB — VITAMIN D 25 HYDROXY (VIT D DEFICIENCY, FRACTURES): VIT D 25 HYDROXY: 28.5 ng/mL — AB (ref 30.0–100.0)

## 2018-06-19 ENCOUNTER — Encounter: Payer: Self-pay | Admitting: Family Medicine

## 2018-06-19 ENCOUNTER — Telehealth: Payer: Self-pay

## 2018-06-19 NOTE — Telephone Encounter (Signed)
Pt advised.   Thanks,   -Glorianna Gott  

## 2018-06-19 NOTE — Telephone Encounter (Signed)
-----   Message from Birdie Sons, MD sent at 06/19/2018  8:12 AM EDT ----- Vitamin d level is a little low, should take vitamin d every day. Otherwise labs including cholesterol and blood cell counts are normal. Check labs yearly.

## 2018-06-24 ENCOUNTER — Encounter: Payer: Self-pay | Admitting: *Deleted

## 2018-06-24 ENCOUNTER — Encounter: Payer: Self-pay | Admitting: Family Medicine

## 2018-06-24 DIAGNOSIS — I700 Atherosclerosis of aorta: Secondary | ICD-10-CM | POA: Insufficient documentation

## 2018-06-24 DIAGNOSIS — J439 Emphysema, unspecified: Secondary | ICD-10-CM | POA: Insufficient documentation

## 2018-09-17 DIAGNOSIS — G4733 Obstructive sleep apnea (adult) (pediatric): Secondary | ICD-10-CM | POA: Diagnosis not present

## 2018-10-20 DIAGNOSIS — M9901 Segmental and somatic dysfunction of cervical region: Secondary | ICD-10-CM | POA: Diagnosis not present

## 2018-10-20 DIAGNOSIS — G5601 Carpal tunnel syndrome, right upper limb: Secondary | ICD-10-CM | POA: Diagnosis not present

## 2018-10-20 DIAGNOSIS — M9907 Segmental and somatic dysfunction of upper extremity: Secondary | ICD-10-CM | POA: Diagnosis not present

## 2018-10-20 DIAGNOSIS — M7918 Myalgia, other site: Secondary | ICD-10-CM | POA: Diagnosis not present

## 2018-10-20 DIAGNOSIS — M50322 Other cervical disc degeneration at C5-C6 level: Secondary | ICD-10-CM | POA: Diagnosis not present

## 2018-10-20 DIAGNOSIS — M50323 Other cervical disc degeneration at C6-C7 level: Secondary | ICD-10-CM | POA: Diagnosis not present

## 2018-10-21 DIAGNOSIS — G5601 Carpal tunnel syndrome, right upper limb: Secondary | ICD-10-CM | POA: Diagnosis not present

## 2018-10-21 DIAGNOSIS — M9907 Segmental and somatic dysfunction of upper extremity: Secondary | ICD-10-CM | POA: Diagnosis not present

## 2018-10-21 DIAGNOSIS — M7918 Myalgia, other site: Secondary | ICD-10-CM | POA: Diagnosis not present

## 2018-10-21 DIAGNOSIS — M9901 Segmental and somatic dysfunction of cervical region: Secondary | ICD-10-CM | POA: Diagnosis not present

## 2018-10-21 DIAGNOSIS — M50323 Other cervical disc degeneration at C6-C7 level: Secondary | ICD-10-CM | POA: Diagnosis not present

## 2018-10-21 DIAGNOSIS — M50322 Other cervical disc degeneration at C5-C6 level: Secondary | ICD-10-CM | POA: Diagnosis not present

## 2018-10-22 DIAGNOSIS — M9901 Segmental and somatic dysfunction of cervical region: Secondary | ICD-10-CM | POA: Diagnosis not present

## 2018-10-22 DIAGNOSIS — M50323 Other cervical disc degeneration at C6-C7 level: Secondary | ICD-10-CM | POA: Diagnosis not present

## 2018-10-22 DIAGNOSIS — M9907 Segmental and somatic dysfunction of upper extremity: Secondary | ICD-10-CM | POA: Diagnosis not present

## 2018-10-22 DIAGNOSIS — M50322 Other cervical disc degeneration at C5-C6 level: Secondary | ICD-10-CM | POA: Diagnosis not present

## 2018-10-22 DIAGNOSIS — G5601 Carpal tunnel syndrome, right upper limb: Secondary | ICD-10-CM | POA: Diagnosis not present

## 2018-10-22 DIAGNOSIS — M7918 Myalgia, other site: Secondary | ICD-10-CM | POA: Diagnosis not present

## 2018-12-10 ENCOUNTER — Other Ambulatory Visit: Payer: Self-pay | Admitting: Family Medicine

## 2018-12-10 DIAGNOSIS — F17210 Nicotine dependence, cigarettes, uncomplicated: Secondary | ICD-10-CM

## 2019-03-16 DIAGNOSIS — G4733 Obstructive sleep apnea (adult) (pediatric): Secondary | ICD-10-CM | POA: Diagnosis not present

## 2019-05-06 DIAGNOSIS — Z1231 Encounter for screening mammogram for malignant neoplasm of breast: Secondary | ICD-10-CM | POA: Diagnosis not present

## 2019-05-06 LAB — HM MAMMOGRAPHY

## 2019-05-11 ENCOUNTER — Encounter: Payer: Self-pay | Admitting: Family Medicine

## 2019-06-15 DIAGNOSIS — G4733 Obstructive sleep apnea (adult) (pediatric): Secondary | ICD-10-CM | POA: Diagnosis not present

## 2019-06-19 ENCOUNTER — Telehealth: Payer: Self-pay | Admitting: *Deleted

## 2019-06-19 NOTE — Telephone Encounter (Signed)
Patient has been notified that lung cancer screening CT scan is due currently or will be in near future. Confirmed that patient is within the appropriate age range, and asymptomatic, (no signs or symptoms of lung cancer). Patient denies illness that would prevent curative treatment for lung cancer if found. Verified smoking history (Current smoker 1ppd). Patient is agreeable for CT scan being scheduled.

## 2019-06-21 ENCOUNTER — Other Ambulatory Visit: Payer: Self-pay | Admitting: *Deleted

## 2019-06-21 DIAGNOSIS — Z87891 Personal history of nicotine dependence: Secondary | ICD-10-CM

## 2019-06-21 DIAGNOSIS — Z122 Encounter for screening for malignant neoplasm of respiratory organs: Secondary | ICD-10-CM

## 2019-06-28 ENCOUNTER — Other Ambulatory Visit: Payer: Self-pay

## 2019-06-28 ENCOUNTER — Ambulatory Visit
Admission: RE | Admit: 2019-06-28 | Discharge: 2019-06-28 | Disposition: A | Discharge status: Home / Self Care | Payer: BC Managed Care – PPO | Source: Ambulatory Visit | Attending: Oncology | Admitting: Oncology

## 2019-06-28 DIAGNOSIS — Z122 Encounter for screening for malignant neoplasm of respiratory organs: Secondary | ICD-10-CM | POA: Diagnosis not present

## 2019-06-28 DIAGNOSIS — Z87891 Personal history of nicotine dependence: Secondary | ICD-10-CM | POA: Diagnosis not present

## 2019-06-29 ENCOUNTER — Telehealth: Payer: Self-pay | Admitting: *Deleted

## 2019-06-29 ENCOUNTER — Encounter: Payer: Self-pay | Admitting: *Deleted

## 2019-06-29 NOTE — Telephone Encounter (Signed)
Called report  IMPRESSION: 1. Lung-RADS 4B. Additional imaging evaluation or consultation with Pulmonology or Thoracic Surgery recommended. Irregular nodular soft tissue in the right apex is probably scarring but is asymmetric and more nodular than on the left. Based on size of this lesion is technically a Lung-RADS 4B. While this is felt to be scarring, follow up low-dose chest CT without contrast in 3 months (please use the following order, "CT CHEST LCS NODULE FOLLOW-UP W/O CM") is recommended. 2.  Emphysema. (ICD10-J43.9) 3.  Aortic Atherosclerois (ICD10-170.0)  These results will be called to the ordering clinician or representative by the Radiologist Assistant, and communication documented in the PACS or zVision Dashboard.   Electronically Signed   By: Misty Stanley M.D.   On: 06/28/2019 15:22

## 2019-06-29 NOTE — Patient Instructions (Signed)
Per recommendations from radiology and Dr. Patsey Berthold, pt will need follow up CT CHEST LCS NODULE FOLLOW-UP W/O CM in 3 months. Burgess Estelle, RN has been notified to help coordinate follow up CT scan and notify pt with results. Since pt does not have established pulmonologist, Dr. Patsey Berthold agreed to see the patient in the future if needed depending on CT scan results in 3 months.

## 2019-07-01 ENCOUNTER — Telehealth: Payer: Self-pay | Admitting: *Deleted

## 2019-07-01 NOTE — Telephone Encounter (Signed)
Notified patient of LDCT lung cancer screening program results with recommendation for 3 month follow up imaging, (this scan has also been reviewed by pulmonology who agrees with this recommendation). Also notified of incidental findings noted below and is encouraged to discuss further with PCP who will receive a copy of this note and/or the CT report. Patient verbalizes understanding.   IMPRESSION: 1. Lung-RADS 4B. Additional imaging evaluation or consultation with Pulmonology or Thoracic Surgery recommended. Irregular nodular soft tissue in the right apex is probably scarring but is asymmetric and more nodular than on the left. Based on size of this lesion is technically a Lung-RADS 4B. While this is felt to be scarring, follow up low-dose chest CT without contrast in 3 months (please use the following order, "CT CHEST LCS NODULE FOLLOW-UP W/O CM") is recommended. 2.  Emphysema. (ICD10-J43.9) 3.  Aortic Atherosclerois (ICD10-170.0)

## 2019-07-13 ENCOUNTER — Encounter: Payer: BLUE CROSS/BLUE SHIELD | Admitting: Family Medicine

## 2019-08-13 DIAGNOSIS — H0279 Other degenerative disorders of eyelid and periocular area: Secondary | ICD-10-CM | POA: Diagnosis not present

## 2019-08-13 DIAGNOSIS — H02832 Dermatochalasis of right lower eyelid: Secondary | ICD-10-CM | POA: Diagnosis not present

## 2019-08-13 DIAGNOSIS — H02834 Dermatochalasis of left upper eyelid: Secondary | ICD-10-CM | POA: Diagnosis not present

## 2019-08-13 DIAGNOSIS — H02413 Mechanical ptosis of bilateral eyelids: Secondary | ICD-10-CM | POA: Diagnosis not present

## 2019-08-13 DIAGNOSIS — H02831 Dermatochalasis of right upper eyelid: Secondary | ICD-10-CM | POA: Diagnosis not present

## 2019-08-17 ENCOUNTER — Encounter: Payer: BLUE CROSS/BLUE SHIELD | Admitting: Family Medicine

## 2019-08-25 ENCOUNTER — Encounter: Payer: BLUE CROSS/BLUE SHIELD | Admitting: Family Medicine

## 2019-09-06 DIAGNOSIS — H53483 Generalized contraction of visual field, bilateral: Secondary | ICD-10-CM | POA: Diagnosis not present

## 2019-09-13 ENCOUNTER — Ambulatory Visit (INDEPENDENT_AMBULATORY_CARE_PROVIDER_SITE_OTHER): Payer: BC Managed Care – PPO | Admitting: Family Medicine

## 2019-09-13 ENCOUNTER — Encounter: Payer: Self-pay | Admitting: Family Medicine

## 2019-09-13 ENCOUNTER — Other Ambulatory Visit: Payer: Self-pay

## 2019-09-13 VITALS — BP 110/70 | HR 79 | Temp 96.6°F | Resp 16 | Ht 64.0 in | Wt 182.6 lb

## 2019-09-13 DIAGNOSIS — E559 Vitamin D deficiency, unspecified: Secondary | ICD-10-CM

## 2019-09-13 DIAGNOSIS — J432 Centrilobular emphysema: Secondary | ICD-10-CM

## 2019-09-13 DIAGNOSIS — Z0000 Encounter for general adult medical examination without abnormal findings: Secondary | ICD-10-CM | POA: Diagnosis not present

## 2019-09-13 DIAGNOSIS — R9431 Abnormal electrocardiogram [ECG] [EKG]: Secondary | ICD-10-CM | POA: Insufficient documentation

## 2019-09-13 DIAGNOSIS — R002 Palpitations: Secondary | ICD-10-CM | POA: Diagnosis not present

## 2019-09-13 DIAGNOSIS — R911 Solitary pulmonary nodule: Secondary | ICD-10-CM

## 2019-09-13 DIAGNOSIS — I700 Atherosclerosis of aorta: Secondary | ICD-10-CM

## 2019-09-13 DIAGNOSIS — F17210 Nicotine dependence, cigarettes, uncomplicated: Secondary | ICD-10-CM

## 2019-09-13 DIAGNOSIS — Z1211 Encounter for screening for malignant neoplasm of colon: Secondary | ICD-10-CM

## 2019-09-13 MED ORDER — CHANTIX STARTING MONTH PAK 0.5 MG X 11 & 1 MG X 42 PO TABS: ORAL_TABLET | ORAL | 0 refills | Status: AC

## 2019-09-13 NOTE — Progress Notes (Signed)
Patient: Theresa White, Female    DOB: 05/02/1956, 63 y.o.   MRN: [id number] Visit Date: 09/13/2019  Today's Provider: Lelon Huh, MD   Chief Complaint  Patient presents with  . Annual Exam   Subjective:    Annual physical exam Theresa White is a 63 y.o. female who presents today for health maintenance and complete physical. She feels well. She reports she is not actively exercising . She reports she is sleeping well.  ----------------------------------------------------------------- Last Reported Mammogram-05/06/19 Normal Gyn- History of Hysterectomy 1996 Colonoscopy- 09/28/14 Review of Systems  All other systems reviewed and are negative.   Social History She  reports that she has been smoking cigarettes. She has been smoking about 1.00 pack per day. She has never used smokeless tobacco. She reports that she does not drink alcohol or use drugs. Social History   Socioeconomic History  . Marital status: Married    Spouse name: Not on file  . Number of children: 2  . Years of education: Not on file  . Highest education level: Not on file  Occupational History  . Occupation: Museum/gallery conservator  Social Needs  . Financial resource strain: Not on file  . Food insecurity    Worry: Not on file    Inability: Not on file  . Transportation needs    Medical: Not on file    Non-medical: Not on file  Tobacco Use  . Smoking status: Current Every Day Smoker    Packs/day: 1.00    Types: Cigarettes  . Smokeless tobacco: Never Used  . Tobacco comment: Started as teenager, averaging 1 ppd since  Substance and Sexual Activity  . Alcohol use: No    Alcohol/week: 0.0 standard drinks  . Drug use: No  . Sexual activity: Not on file  Lifestyle  . Physical activity    Days per week: Not on file    Minutes per session: Not on file  . Stress: Not on file  Relationships  . Social Herbalist on phone: Not on file    Gets together: Not on file    Attends  religious service: Not on file    Active member of club or organization: Not on file    Attends meetings of clubs or organizations: Not on file    Relationship status: Not on file  Other Topics Concern  . Not on file  Social History Narrative  . Not on file    Patient Active Problem List   Diagnosis Date Noted  . Aortic atherosclerosis ([place]) 06/24/2018  . Emphysema lung ([place]) 06/24/2018  . Colon polyps   . GERD (gastroesophageal reflux disease)   . Diverticulitis of colon 03/18/2016  . Abnormal mammogram 03/18/2016  . Smoking greater than 30 pack years 04/12/2015  . History of colonic polyps 09/20/2014  . Diffuse cystic mastopathy 02/17/2013  . Sleep apnea   . Allergic rhinitis 12/05/2009  . Post menopausal syndrome 12/05/2009  . Avitaminosis D 05/17/2009  . Anxiety disorder 11/01/2008  . Acid reflux 07/27/2007  . Headache, tension-type 07/27/2007  . Compulsive tobacco user syndrome 07/27/2007  . Cannot sleep 07/24/2007    Past Surgical History:  Procedure Laterality Date  . ABDOMINAL EXPLORATION SURGERY    . COLONOSCOPY  2010, 09/28/14   DR. Elliott  . FOOT SURGERY Left 2010  . TONSILLECTOMY AND ADENOIDECTOMY    . TOTAL ABDOMINAL HYSTERECTOMY W/ BILATERAL SALPINGOOPHORECTOMY  1996   Laparoscopically assisted for pelvic pain  Family History  Family Status  Relation Name Status  . Mother  Deceased at age 52  . Father  Deceased at age 52  . Sister  Deceased at age 96  . Sister  Deceased at age 16  . Brother  Alive  . Sister  Alive  . Brother  Alive   Her family history includes Aneurysm in her sister; Bladder Cancer (age of onset: 45) in her brother; Cervical cancer (age of onset: 59) in her sister; Diabetes in her mother; Emphysema in her father; Hypertension in her father; Kidney failure in her mother; Stroke in her mother.     Allergies  Allergen Reactions  . Codeine Itching    Previous Medications   ASCORBIC ACID (VITAMIN C PO)    Take by mouth as  directed.   ASPIRIN 81 MG TABLET    Take 81 mg by mouth daily.   CALCIUM PO    Take by mouth daily.   CETIRIZINE (ZYRTEC ALLERGY) 10 MG TABLET    Zyrtec 10 mg tablet   1 tablet every other day by oral route.   CYANOCOBALAMIN (VITAMIN B-12 PO)    Take by mouth daily.   DEXILANT 60 MG CAPSULE       LORATADINE (CLARITIN) 10 MG TABLET    Take 10 mg by mouth daily.   MONTELUKAST (SINGULAIR) 10 MG TABLET    TAKE 1 TABLET DAILY   OMEGA-3 FATTY ACIDS (FISH OIL PO)    Take by mouth daily.   VITAMIN D, CHOLECALCIFEROL, PO    Take 1 tablet by mouth daily.    Patient Care Team: Birdie Sons, MD as PCP - General (Family Medicine) Christene Lye, MD (General Surgery) Carloyn Manner, MD as Referring Physician (Otolaryngology) Earnestine Leys, MD (Orthopedic Surgery)      Objective:   Vitals: BP 110/70   Pulse 79   Temp (!) 96.6 F (35.9 C) (Oral)   Resp 16   Ht 5\' 4"  (1.626 m)   Wt 182 lb 9.6 oz (82.8 kg)   BMI 31.34 kg/m    Physical Exam   General Appearance:    Mildly obese female. Alert, cooperative, in distress, appears stated age   Head:    Normocephalic, without obvious abnormality, atraumatic  Eyes:    PERRL, conjunctiva/corneas clear, EOM's intact, fundi    benign, both eyes  Ears:    Normal TM's and external ear canals, both ears  Nose:   Nares normal, septum midline, mucosa normal, no drainage    or sinus tenderness  Throat:   Lips, mucosa, and tongue normal; teeth and gums normal  Neck:   Supple, symmetrical, trachea midline, no adenopathy;    thyroid:  no enlargement/tenderness/nodules; no carotid   bruit or JVD  Back:     Symmetric, no curvature, ROM normal, no CVA tenderness  Lungs:     Clear to auscultation bilaterally, respirations unlabored  Chest Wall:    No tenderness or deformity   Heart:    Normal heart rate. Normal rhythm. No murmurs, rubs, or gallops.   Breast Exam:    normal appearance, no masses or tenderness  Abdomen:     Soft, non-tender,  bowel sounds active all four quadrants,    no masses, no organomegaly  Pelvic:    deferred  Extremities:   All extremities are intact. No cyanosis or edema  Pulses:   2+ and symmetric all extremities  Skin:   Skin color, texture, turgor normal, no rashes or lesions  Lymph nodes:   Cervical, supraclavicular, and axillary nodes normal  Neurologic:   CNII-XII intact, normal strength, sensation and reflexes    throughout    Depression Screen PHQ 2/9 Scores 09/13/2019 09/13/2019 06/11/2018 06/10/2017  PHQ - 2 Score 0 0 0 0  PHQ- 9 Score 0 - 0 0      Assessment & Plan:     Routine Health Maintenance and Physical Exam  Exercise Activities and Dietary recommendations Goals   None      There is no immunization history on file for this patient.  Health Maintenance  Topic Date Due  . HIV Screening  01/04/1971  . INFLUENZA VACCINE  03/01/2020 (Originally 07/03/2019)  . TETANUS/TDAP  09/12/2020 (Originally 01/04/1975)  . COLONOSCOPY  09/29/2019  . MAMMOGRAM  05/05/2020  . Hepatitis C Screening  Completed     Discussed health benefits of physical activity, and encouraged her to engage in regular exercise appropriate for her age and condition.    --------------------------------------------------------------------  1. Annual physical exam  - Comprehensive metabolic panel - Lipid panel - TSH - CBC  2. Centrilobular emphysema ([place]) Per LDCT, asymptomatic.   3. Aortic atherosclerosis ([place]) Per .ldct Asymptomatic. Compliant with medication.  Continue aggressive risk factor modification.   - Comprehensive metabolic panel - Lipid panel - CBC  4. Solitary pulmonary nodule  - CT CHEST LCS NODULE F/U WO  CONTRAST; Future  5. . Avitaminosis D  - VITAMIN D 25 Hydroxy (Vit-D Deficiency, Fractures)  6. Smoking greater than 30 pack years She states she did stop smoking when taking Chantix in the past and is interested in trying it again. Advised she could take for extended  period of time if effective.  - varenicline (CHANTIX STARTING MONTH PAK) 0.5 MG X 11 & 1 MG X 42 tablet; Take one 0.5 mg tablet daily for 3 days, then take one 0.5 mg tablet twice daily for 4 days, then take one 1 mg tablet twice daily  Dispense: 53 tablet; Refill: 0  7. Screening for colon cancer  - Ambulatory referral to Gastroenterology  8. Palpitations  - EKG 12-Lead - TSH

## 2019-09-14 LAB — COMPREHENSIVE METABOLIC PANEL
ALT: 13 IU/L (ref 0–32)
AST: 20 IU/L (ref 0–40)
Albumin/Globulin Ratio: 2 (ref 1.2–2.2)
Albumin: 4.1 g/dL (ref 3.8–4.8)
Alkaline Phosphatase: 133 IU/L — ABNORMAL HIGH (ref 39–117)
BUN/Creatinine Ratio: 14 (ref 12–28)
BUN: 13 mg/dL (ref 8–27)
Bilirubin Total: 0.3 mg/dL (ref 0.0–1.2)
CO2: 22 mmol/L (ref 20–29)
Calcium: 9.4 mg/dL (ref 8.7–10.3)
Chloride: 106 mmol/L (ref 96–106)
Creatinine, Ser: 0.96 mg/dL (ref 0.57–1.00)
GFR calc Af Amer: 73 mL/min/{1.73_m2} (ref >59)
GFR calc non Af Amer: 63 mL/min/{1.73_m2} (ref >59)
Globulin, Total: 2.1 g/dL (ref 1.5–4.5)
Glucose: 97 mg/dL (ref 65–99)
Potassium: 4.1 mmol/L (ref 3.5–5.2)
Sodium: 141 mmol/L (ref 134–144)
Total Protein: 6.2 g/dL (ref 6.0–8.5)

## 2019-09-14 LAB — LIPID PANEL
Chol/HDL Ratio: 3 ratio (ref 0.0–4.4)
Cholesterol, Total: 141 mg/dL (ref 100–199)
HDL: 47 mg/dL (ref >39)
LDL Chol Calc (NIH): 64 mg/dL (ref 0–99)
Triglycerides: 182 mg/dL — ABNORMAL HIGH (ref 0–149)
VLDL Cholesterol Cal: 30 mg/dL (ref 5–40)

## 2019-09-14 LAB — CBC
Hematocrit: 42.5 % (ref 34.0–46.6)
Hemoglobin: 14.6 g/dL (ref 11.1–15.9)
MCH: 32.8 pg (ref 26.6–33.0)
MCHC: 34.4 g/dL (ref 31.5–35.7)
MCV: 96 fL (ref 79–97)
Platelets: 317 10*3/uL (ref 150–450)
RBC: 4.45 x10E6/uL (ref 3.77–5.28)
RDW: 12.9 % (ref 11.7–15.4)
WBC: 8.7 10*3/uL (ref 3.4–10.8)

## 2019-09-14 LAB — VITAMIN D 25 HYDROXY (VIT D DEFICIENCY, FRACTURES): Vit D, 25-Hydroxy: 32 ng/mL (ref 30.0–100.0)

## 2019-09-14 LAB — TSH: TSH: 3.29 u[IU]/mL (ref 0.450–4.500)

## 2019-09-15 ENCOUNTER — Telehealth: Payer: Self-pay

## 2019-09-15 NOTE — Telephone Encounter (Signed)
-----   Message from Birdie Sons, MD sent at 09/14/2019  7:49 AM EDT ----- Labs are all good. Cholesterol well controlled at 141. Continue current medications.  Check yearly.

## 2019-09-15 NOTE — Telephone Encounter (Signed)
[id number] 

## 2019-09-16 DIAGNOSIS — G4733 Obstructive sleep apnea (adult) (pediatric): Secondary | ICD-10-CM | POA: Diagnosis not present

## 2019-09-17 ENCOUNTER — Ambulatory Visit: Payer: BC Managed Care – PPO

## 2019-09-20 NOTE — Telephone Encounter (Signed)
Patient advised.

## 2019-09-21 ENCOUNTER — Other Ambulatory Visit: Payer: Self-pay

## 2019-09-21 ENCOUNTER — Ambulatory Visit
Admission: RE | Admit: 2019-09-21 | Discharge: 2019-09-21 | Disposition: A | Discharge status: Home / Self Care | Payer: BC Managed Care – PPO | Source: Ambulatory Visit | Attending: Family Medicine | Admitting: Family Medicine

## 2019-09-21 ENCOUNTER — Telehealth: Payer: Self-pay

## 2019-09-21 DIAGNOSIS — R911 Solitary pulmonary nodule: Secondary | ICD-10-CM | POA: Insufficient documentation

## 2019-09-21 DIAGNOSIS — J439 Emphysema, unspecified: Secondary | ICD-10-CM | POA: Diagnosis not present

## 2019-09-21 NOTE — Telephone Encounter (Signed)
Returned patients call [phone] colonoscopy.  LVM for her to call office back to schedule.  Thanks [employer]

## 2019-09-24 NOTE — Patient Instructions (Signed)
.   Please review the attached list of medications and notify my office if there are any errors.   . Please bring all of your medications to every appointment so we can make sure that our medication list is the same as yours.   . It is especially important to get the annual flu vaccine this year. If you haven't had it already, please go to your pharmacy or call the office as soon as possible to schedule you flu shot.  

## 2019-09-30 ENCOUNTER — Other Ambulatory Visit: Payer: Self-pay | Admitting: Family Medicine

## 2019-09-30 DIAGNOSIS — R911 Solitary pulmonary nodule: Secondary | ICD-10-CM

## 2019-10-05 ENCOUNTER — Encounter: Payer: Self-pay | Admitting: *Deleted

## 2019-10-12 ENCOUNTER — Other Ambulatory Visit: Payer: Self-pay

## 2019-10-12 ENCOUNTER — Ambulatory Visit
Admission: RE | Admit: 2019-10-12 | Discharge: 2019-10-12 | Disposition: A | Discharge status: Home / Self Care | Payer: BC Managed Care – PPO | Source: Ambulatory Visit | Attending: Family Medicine | Admitting: Family Medicine

## 2019-10-12 DIAGNOSIS — R911 Solitary pulmonary nodule: Secondary | ICD-10-CM | POA: Diagnosis not present

## 2019-10-12 LAB — GLUCOSE, CAPILLARY: Glucose-Capillary: 94 mg/dL (ref 70–99)

## 2019-10-12 MED ORDER — FLUDEOXYGLUCOSE F - 18 (FDG) INJECTION
9.4000 | Freq: Once | INTRAVENOUS | Status: AC | PRN
  Administered 2019-10-12: 9.95 via INTRAVENOUS

## 2019-10-19 ENCOUNTER — Telehealth: Payer: Self-pay | Admitting: Family Medicine

## 2019-10-19 MED ORDER — VARENICLINE TARTRATE 1 MG PO TABS: 1.0000 mg | ORAL_TABLET | Freq: Two times a day (BID) | ORAL | 5 refills | Status: DC

## 2019-10-19 NOTE — Telephone Encounter (Signed)
Pt called in to remind provider to call in her second dose of chantix, pt says that she was told to remind provider. So far only the starter pack has been sent in/   Pharmacy:  [facility] [address], [place] - [place] [phone] (Phone) [phone] (Fax)

## 2019-10-19 NOTE — Telephone Encounter (Signed)
From [facility], please review 

## 2019-10-19 NOTE — Telephone Encounter (Signed)
Ok, have sent prescription for continuing pack

## 2019-10-20 ENCOUNTER — Telehealth: Payer: Self-pay | Admitting: *Deleted

## 2019-10-20 NOTE — Telephone Encounter (Signed)
After discussion with thoracic surgery and lung nodule program coordinator, patient is contacted and lung imaging reviewed.   Discussed in length recommendations for short term follow up vs CT guided biopsy. Discussed pros and cons of each intervention as well as need of radiologist approval for CT guided biopsy.  Patient verbalizes understanding and will discuss with her husband and let me know what she would like to do.

## 2019-10-25 ENCOUNTER — Telehealth: Payer: Self-pay | Admitting: *Deleted

## 2019-10-25 NOTE — Telephone Encounter (Signed)
After discussion with interventional radiologist Dr. Pascal Lux, who spoke with pulmonary radiologist Dr. Weber Cooks, patient is contacted and given recommendation for follow up CT scan of chest in 12 months. Both radiologists feel the area in question is benign scar tissue and do not recommend or approve CT guided biopsy. Reviewed options with patient of consulting with thoracic surgery as well as possibility of biopsy at other facility if she disagreed with radiology recommendation. Patient is agreeable to plan of observing area with future CT scan and will contact me for any questions or concerns.

## 2019-12-07 ENCOUNTER — Telehealth: Payer: Self-pay

## 2019-12-07 ENCOUNTER — Other Ambulatory Visit: Payer: Self-pay

## 2019-12-07 DIAGNOSIS — Z1211 Encounter for screening for malignant neoplasm of colon: Secondary | ICD-10-CM

## 2019-12-07 DIAGNOSIS — Z86010 Personal history of colonic polyps: Secondary | ICD-10-CM

## 2019-12-07 NOTE — Telephone Encounter (Signed)
Gastroenterology Pre-Procedure Review  Request Date: Monday 12/20/19 Requesting Physician: Dr. Marius Ditch   PATIENT REVIEW QUESTIONS: The patient responded to the following health history questions as indicated:    1. Are you having any GI issues? no 2. Do you have a personal history of Polyps? yes (5 years ago) 3. Do you have a family history of Colon Cancer or Polyps? no 4. Diabetes Mellitus? no 5. Joint replacements in the past 12 months?no 6. Major health problems in the past 3 months?no 7. Any artificial heart valves, MVP, or defibrillator?no    MEDICATIONS & ALLERGIES:    Patient reports the following regarding taking any anticoagulation/antiplatelet therapy:   Plavix, Coumadin, Eliquis, Xarelto, Lovenox, Pradaxa, Brilinta, or Effient? no Aspirin? yes (81 mg daily)  Patient confirms/reports the following medications:  Current Outpatient Medications  Medication Sig Dispense Refill  . Ascorbic Acid (VITAMIN C PO) Take by mouth as directed.    Marland Kitchen aspirin 81 MG tablet Take 81 mg by mouth daily.    Marland Kitchen CALCIUM PO Take by mouth daily.    . cetirizine (ZYRTEC ALLERGY) 10 MG tablet Zyrtec 10 mg tablet   1 tablet every other day by oral route.    . Cyanocobalamin (VITAMIN B-12 PO) Take by mouth daily.    Marland Kitchen DEXILANT 60 MG capsule     . loratadine (CLARITIN) 10 MG tablet Take 10 mg by mouth daily.    . montelukast (SINGULAIR) 10 MG tablet TAKE 1 TABLET DAILY 90 tablet 4  . Omega-3 Fatty Acids (FISH OIL PO) Take by mouth daily.    . varenicline (CHANTIX CONTINUING MONTH PAK) 1 MG tablet Take 1 tablet (1 mg total) by mouth 2 (two) times daily. 60 tablet 5  . VITAMIN D, CHOLECALCIFEROL, PO Take 1 tablet by mouth daily.     No current facility-administered medications for this visit.    Patient confirms/reports the following allergies:  Allergies  Allergen Reactions  . Codeine Itching    No orders of the defined types were placed in this encounter.   AUTHORIZATION INFORMATION Primary  Insurance: 1D#: Group #:  Secondary Insurance: 1D#: Group #:  SCHEDULE INFORMATION: Date: Monday 12/20/19 Time: Location:[facility]

## 2019-12-07 NOTE — Telephone Encounter (Signed)
-----   Message from Vanetta Mulders, [place] sent at 09/30/2019  2:03 PM EDT ----- Regarding: Schedule Colon in January Call pt after Christmas to schedule in January.

## 2019-12-16 ENCOUNTER — Other Ambulatory Visit
Admission: RE | Admit: 2019-12-16 | Discharge: 2019-12-16 | Disposition: A | Discharge status: Home / Self Care | Payer: BC Managed Care – PPO | Source: Ambulatory Visit | Attending: Gastroenterology | Admitting: Gastroenterology

## 2019-12-16 DIAGNOSIS — Z01812 Encounter for preprocedural laboratory examination: Secondary | ICD-10-CM | POA: Insufficient documentation

## 2019-12-16 DIAGNOSIS — Z20822 Contact with and (suspected) exposure to covid-19: Secondary | ICD-10-CM | POA: Insufficient documentation

## 2019-12-16 LAB — SARS CORONAVIRUS 2 (TAT 6-24 HRS): SARS Coronavirus 2: NEGATIVE

## 2019-12-20 ENCOUNTER — Encounter: Payer: Self-pay | Admitting: Gastroenterology

## 2019-12-20 ENCOUNTER — Ambulatory Visit
Admission: RE | Admit: 2019-12-20 | Discharge: 2019-12-20 | Disposition: A | Discharge status: Home / Self Care | Payer: BC Managed Care – PPO | Attending: Gastroenterology | Admitting: Gastroenterology

## 2019-12-20 ENCOUNTER — Ambulatory Visit: Payer: BC Managed Care – PPO | Admitting: Anesthesiology

## 2019-12-20 ENCOUNTER — Encounter
Admission: RE | Disposition: A | Discharge status: Home / Self Care | Payer: Self-pay | Source: Home / Self Care | Attending: Gastroenterology

## 2019-12-20 ENCOUNTER — Other Ambulatory Visit: Payer: Self-pay

## 2019-12-20 DIAGNOSIS — Z1211 Encounter for screening for malignant neoplasm of colon: Secondary | ICD-10-CM | POA: Insufficient documentation

## 2019-12-20 DIAGNOSIS — Z833 Family history of diabetes mellitus: Secondary | ICD-10-CM | POA: Diagnosis not present

## 2019-12-20 DIAGNOSIS — Z79899 Other long term (current) drug therapy: Secondary | ICD-10-CM | POA: Diagnosis not present

## 2019-12-20 DIAGNOSIS — Z8049 Family history of malignant neoplasm of other genital organs: Secondary | ICD-10-CM | POA: Diagnosis not present

## 2019-12-20 DIAGNOSIS — Z823 Family history of stroke: Secondary | ICD-10-CM | POA: Insufficient documentation

## 2019-12-20 DIAGNOSIS — Z90722 Acquired absence of ovaries, bilateral: Secondary | ICD-10-CM | POA: Insufficient documentation

## 2019-12-20 DIAGNOSIS — K5730 Diverticulosis of large intestine without perforation or abscess without bleeding: Secondary | ICD-10-CM | POA: Diagnosis not present

## 2019-12-20 DIAGNOSIS — Z860100 Personal history of colon polyps, unspecified: Secondary | ICD-10-CM

## 2019-12-20 DIAGNOSIS — Z90710 Acquired absence of both cervix and uterus: Secondary | ICD-10-CM | POA: Diagnosis not present

## 2019-12-20 DIAGNOSIS — K633 Ulcer of intestine: Secondary | ICD-10-CM

## 2019-12-20 DIAGNOSIS — Z8249 Family history of ischemic heart disease and other diseases of the circulatory system: Secondary | ICD-10-CM | POA: Insufficient documentation

## 2019-12-20 DIAGNOSIS — R519 Headache, unspecified: Secondary | ICD-10-CM | POA: Insufficient documentation

## 2019-12-20 DIAGNOSIS — Z885 Allergy status to narcotic agent status: Secondary | ICD-10-CM | POA: Insufficient documentation

## 2019-12-20 DIAGNOSIS — K635 Polyp of colon: Secondary | ICD-10-CM | POA: Diagnosis not present

## 2019-12-20 DIAGNOSIS — J449 Chronic obstructive pulmonary disease, unspecified: Secondary | ICD-10-CM | POA: Diagnosis not present

## 2019-12-20 DIAGNOSIS — K621 Rectal polyp: Secondary | ICD-10-CM

## 2019-12-20 DIAGNOSIS — K5790 Diverticulosis of intestine, part unspecified, without perforation or abscess without bleeding: Secondary | ICD-10-CM | POA: Diagnosis not present

## 2019-12-20 DIAGNOSIS — Z8052 Family history of malignant neoplasm of bladder: Secondary | ICD-10-CM | POA: Insufficient documentation

## 2019-12-20 DIAGNOSIS — K219 Gastro-esophageal reflux disease without esophagitis: Secondary | ICD-10-CM | POA: Diagnosis not present

## 2019-12-20 DIAGNOSIS — Z87891 Personal history of nicotine dependence: Secondary | ICD-10-CM | POA: Diagnosis not present

## 2019-12-20 DIAGNOSIS — Z841 Family history of disorders of kidney and ureter: Secondary | ICD-10-CM | POA: Insufficient documentation

## 2019-12-20 DIAGNOSIS — D123 Benign neoplasm of transverse colon: Secondary | ICD-10-CM | POA: Diagnosis not present

## 2019-12-20 DIAGNOSIS — Z7982 Long term (current) use of aspirin: Secondary | ICD-10-CM | POA: Diagnosis not present

## 2019-12-20 DIAGNOSIS — G4730 Sleep apnea, unspecified: Secondary | ICD-10-CM | POA: Insufficient documentation

## 2019-12-20 DIAGNOSIS — Z825 Family history of asthma and other chronic lower respiratory diseases: Secondary | ICD-10-CM | POA: Insufficient documentation

## 2019-12-20 HISTORY — PX: COLONOSCOPY WITH PROPOFOL: SHX5780

## 2019-12-20 SURGERY — COLONOSCOPY WITH PROPOFOL
Anesthesia: General

## 2019-12-20 MED ORDER — SODIUM CHLORIDE 0.9 % IV SOLN: INTRAVENOUS | Status: DC

## 2019-12-20 MED ORDER — PROPOFOL 500 MG/50ML IV EMUL
INTRAVENOUS | Status: AC
  Filled 2019-12-20: qty 50

## 2019-12-20 MED ORDER — PROPOFOL 500 MG/50ML IV EMUL
INTRAVENOUS | Status: DC | PRN
  Administered 2019-12-20: 150 ug/kg/min via INTRAVENOUS
  Administered 2019-12-20: 30 mg via INTRAVENOUS
  Administered 2019-12-20: 50 mg via INTRAVENOUS

## 2019-12-20 MED ORDER — LIDOCAINE HCL (PF) 2 % IJ SOLN
INTRAMUSCULAR | Status: AC
  Filled 2019-12-20: qty 10

## 2019-12-20 NOTE — Anesthesia Postprocedure Evaluation (Signed)
Anesthesia Post Note  Patient: Theresa White  Procedure(s) Performed: COLONOSCOPY WITH PROPOFOL (N/A )  Patient location during evaluation: Endoscopy Anesthesia Type: General Level of consciousness: awake and alert Pain management: pain level controlled Vital Signs Assessment: post-procedure vital signs reviewed and stable Respiratory status: spontaneous breathing, nonlabored ventilation, respiratory function stable and patient connected to nasal cannula oxygen Cardiovascular status: blood pressure returned to baseline and stable Postop Assessment: no apparent nausea or vomiting Anesthetic complications: no     Last Vitals:  Vitals:   12/20/19 1041 12/20/19 1051  BP: 127/73 (!) 141/83  Pulse: 75 69  Resp: 15 17  Temp:    SpO2: 100% 100%    Last Pain:  Vitals:   12/20/19 1051  TempSrc:   PainSc: 0-No pain                 Precious Haws Xitlalli Newhard

## 2019-12-20 NOTE — H&P (Signed)
Cephas Darby, MD [address]  [place]  [place],  [address]  Main: [phone]  Fax: [phone] Pager: [phone]  Primary Care Physician:  Birdie Sons, MD Primary Gastroenterologist:  Dr. Cephas Darby  Pre-Procedure History & Physical: HPI:  TAAHIRA BIANCHINI is a 64 y.o. female is here for an colonoscopy.   Past Medical History:  Diagnosis Date  . Bowel trouble 1995  . Diffuse cystic mastopathy 2012  . Tension headache     Past Surgical History:  Procedure Laterality Date  . ABDOMINAL EXPLORATION SURGERY    . COLONOSCOPY  2010, 09/28/14   DR. Elliott  . FOOT SURGERY Left 2010  . TONSILLECTOMY AND ADENOIDECTOMY    . TOTAL ABDOMINAL HYSTERECTOMY W/ BILATERAL SALPINGOOPHORECTOMY  1996   Laparoscopically assisted for pelvic pain    Prior to Admission medications   Medication Sig Start Date End Date Taking? Authorizing Provider  Ascorbic Acid (VITAMIN C PO) Take by mouth as directed.   Yes [provider]  aspirin 81 MG tablet Take 81 mg by mouth daily.   Yes [provider]  CALCIUM PO Take by mouth daily.   Yes [provider]  Cyanocobalamin (VITAMIN B-12 PO) Take by mouth daily.   Yes [provider]  DEXILANT 60 MG capsule  12/26/15  Yes [provider]  loratadine (CLARITIN) 10 MG tablet Take 10 mg by mouth daily.   Yes [provider]  montelukast (SINGULAIR) 10 MG tablet TAKE 1 TABLET DAILY 12/10/18  Yes Birdie Sons, MD  Potassium 99 MG TABS Take 99 mg by mouth.   Yes [provider]  VITAMIN D, CHOLECALCIFEROL, PO Take 1 tablet by mouth daily.   Yes [provider]  cetirizine (ZYRTEC ALLERGY) 10 MG tablet Zyrtec 10 mg tablet   1 tablet every other day by oral route. 05/17/11   [provider]  Omega-3 Fatty Acids (FISH OIL PO) Take by mouth daily.    [provider]  varenicline (CHANTIX CONTINUING MONTH PAK) 1 MG tablet Take 1 tablet (1 mg total) by  mouth 2 (two) times daily. Patient not taking: Reported on 12/20/2019 10/19/19   Birdie Sons, MD    Allergies as of 12/07/2019 - Review Complete 09/13/2019  Allergen Reaction Noted  . Codeine Itching 01/09/2013    Family History  Problem Relation Age of Onset  . Kidney failure Mother   . Stroke Mother   . Diabetes Mother   . Emphysema Father   . Hypertension Father   . Aneurysm Sister        Cerebral  . Cervical cancer Sister 3  . Bladder Cancer Brother 2    Social History   Socioeconomic History  . Marital status: Married    Spouse name: Not on file  . Number of children: 2  . Years of education: Not on file  . Highest education level: Not on file  Occupational History  . Occupation: Museum/gallery conservator  Tobacco Use  . Smoking status: Current Every Day Smoker    Packs/day: 1.00    Types: Cigarettes  . Smokeless tobacco: Never Used  . Tobacco comment: Started as teenager, averaging 1 ppd since  Substance and Sexual Activity  . Alcohol use: No    Alcohol/week: 0.0 standard drinks  . Drug use: No  . Sexual activity: Not on file  Other Topics Concern  . Not on file  Social History Narrative  . Not on file   Social  Determinants of Health   Financial Resource Strain:   . Difficulty of Paying Living Expenses: Not on file  Food Insecurity:   . Worried About Charity fundraiser in the Last Year: Not on file  . Ran Out of Food in the Last Year: Not on file  Transportation Needs:   . Lack of Transportation (Medical): Not on file  . Lack of Transportation (Non-Medical): Not on file  Physical Activity:   . Days of Exercise per Week: Not on file  . Minutes of Exercise per Session: Not on file  Stress:   . Feeling of Stress : Not on file  Social Connections:   . Frequency of Communication with Friends and Family: Not on file  . Frequency of Social Gatherings with Friends and Family: Not on file  . Attends Religious Services: Not on file  . Active Member of  Clubs or Organizations: Not on file  . Attends Archivist Meetings: Not on file  . Marital Status: Not on file  Intimate Partner Violence:   . Fear of Current or Ex-Partner: Not on file  . Emotionally Abused: Not on file  . Physically Abused: Not on file  . Sexually Abused: Not on file    Review of Systems: See HPI, otherwise negative ROS  Physical Exam: BP 137/85   Pulse 78   Temp (!) 97 F (36.1 C) (Temporal)   Resp 16   Ht 5\' 4"  (1.626 m)   Wt 81.6 kg   SpO2 100%   BMI 30.90 kg/m  General:   Alert,  pleasant and cooperative in NAD Head:  Normocephalic and atraumatic. Neck:  Supple; no masses or thyromegaly. Lungs:  Clear throughout to auscultation.    Heart:  Regular rate and rhythm. Abdomen:  Soft, nontender and nondistended. Normal bowel sounds, without guarding, and without rebound.   Neurologic:  Alert and  oriented x4;  grossly normal neurologically.  Impression/Plan: RAENGEL COLEE is here for an colonoscopy to be performed for colon cancer screening  Risks, benefits, limitations, and alternatives regarding  colonoscopy have been reviewed with the patient.  Questions have been answered.  All parties agreeable.   Sherri Sear, MD  12/20/2019, 9:18 AM

## 2019-12-20 NOTE — Transfer of Care (Signed)
Immediate Anesthesia Transfer of Care Note  Patient: Theresa White  Procedure(s) Performed: COLONOSCOPY WITH PROPOFOL (N/A )  Patient Location: PACU  Anesthesia Type:General  Level of Consciousness: awake, alert , oriented and drowsy  Airway & Oxygen Therapy: Patient Spontanous Breathing  Post-op Assessment: Report given to RN and Post -op Vital signs reviewed and stable  Post vital signs: Reviewed and stable  Last Vitals:  Vitals Value Taken Time  BP 110/59 12/20/19 1021  Temp 36.1 C 12/20/19 1021  Pulse 83 12/20/19 1021  Resp 20 12/20/19 1021  SpO2 96 % 12/20/19 1021    Last Pain:  Vitals:   12/20/19 1021  TempSrc: Temporal  PainSc: 0-No pain         Complications: No apparent anesthesia complications

## 2019-12-20 NOTE — Op Note (Signed)
[facility] Gastroenterology Patient Name: Theresa White Procedure Date: 12/20/2019 9:50 AM MRN: [id number] Account #: [id number] Date of Birth: 02-28-1956 Admit Type: Outpatient Age: 64 Room: [facility] ENDO ROOM 2 Gender: Female Note Status: Finalized Procedure:             Colonoscopy Indications:           High risk colon cancer surveillance: Personal history                         of colonic polyps, Last colonoscopy: October 2015 Providers:             Lin Landsman MD, MD Referring MD:          Kirstie Peri. Caryn Section, MD (Referring MD) Medicines:             Monitored Anesthesia Care Complications:         No immediate complications. Estimated blood loss: None. Procedure:             Pre-Anesthesia Assessment:                        - Prior to the procedure, a History and Physical was                         performed, and patient medications and allergies were                         reviewed. The patient is competent. The risks and                         benefits of the procedure and the sedation options and                         risks were discussed with the patient. All questions                         were answered and informed consent was obtained.                         Patient identification and proposed procedure were                         verified by the physician, the nurse, the                         anesthesiologist, the anesthetist and the technician                         in the pre-procedure area in the procedure room in the                         endoscopy suite. Mental Status Examination: alert and                         oriented. Airway Examination: normal oropharyngeal                         airway and neck mobility. Respiratory Examination:  clear to auscultation. CV Examination: normal.                         Prophylactic Antibiotics: The patient does not require                         prophylactic  antibiotics. Prior Anticoagulants: The                         patient has taken no previous anticoagulant or                         antiplatelet agents. ASA Grade Assessment: III - A                         patient with severe systemic disease. After reviewing                         the risks and benefits, the patient was deemed in                         satisfactory condition to undergo the procedure. The                         anesthesia plan was to use monitored anesthesia care                         (MAC). Immediately prior to administration of                         medications, the patient was re-assessed for adequacy                         to receive sedatives. The heart rate, respiratory                         rate, oxygen saturations, blood pressure, adequacy of                         pulmonary ventilation, and response to care were                         monitored throughout the procedure. The physical                         status of the patient was re-assessed after the                         procedure.                        After obtaining informed consent, the colonoscope was                         passed under direct vision. Throughout the procedure,                         the patient's blood pressure, pulse, and oxygen  saturations were monitored continuously. The                         Colonoscope was introduced through the anus and                         advanced to the the cecum, identified by appendiceal                         orifice and ileocecal valve. The colonoscopy was                         technically difficult and complex due to multiple                         diverticula in the colon. Successful completion of the                         procedure was aided by applying abdominal pressure.                         The patient tolerated the procedure well. The quality                         of the bowel preparation was  evaluated using the BBPS                         [facility] Bowel Preparation Scale) with scores of: Right                         Colon = 3, Transverse Colon = 3 and Left Colon = 3                         (entire mucosa seen well with no residual staining,                         small fragments of stool or opaque liquid). The total                         BBPS score equals 9. Findings:      The perianal and digital rectal examinations were normal. Pertinent       negatives include normal sphincter tone and no palpable rectal lesions.      A 5 mm polyp was found in the rectum. The polyp was sessile. Polypectomy       was attempted, initially using a cold snare. Polyp resection was       incomplete with this device. This intervention then required a different       device and polypectomy technique. The polyp was removed with a cold       biopsy forceps. Resection and retrieval were complete.      Multiple diverticula were found in the sigmoid colon. There was       narrowing of the colon in association with the diverticular opening.       There was evidence of an impacted diverticulum. There was no evidence of       diverticular bleeding.      The retroflexed view of the distal  rectum and anal verge was normal and       showed no anal or rectal abnormalities.      A localized area of ulcerated mucosa was found in the transverse colon.       Biopsies were taken with a cold forceps for histology.      The exam was otherwise without abnormality. Impression:            - One 5 mm polyp in the rectum, removed with a cold                         biopsy forceps. Resected and retrieved.                        - Severe diverticulosis in the sigmoid colon. There                         was narrowing of the colon in association with the                         diverticular opening. There was evidence of an                         impacted diverticulum. There was no evidence of                          diverticular bleeding.                        - The distal rectum and anal verge are normal on                         retroflexion view.                        - Ulcerated mucosa in the transverse colon. Biopsied.                        - The examination was otherwise normal. Recommendation:        - Discharge patient to home (with escort).                        - Resume previous diet today.                        - Continue present medications.                        - Await pathology results.                        - Repeat colonoscopy in 7-10 years for surveillance                         based on pathology results. Procedure Code(s):     --- Professional ---                        [phone], Colonoscopy, flexible; with biopsy, single or  multiple Diagnosis Code(s):     --- Professional ---                        Z86.010, Personal history of colonic polyps                        K62.1, Rectal polyp                        K57.30, Diverticulosis of large intestine without                         perforation or abscess without bleeding                        K63.3, Ulcer of intestine CPT copyright 2019 American Medical Association. All rights reserved. The codes documented in this report are preliminary and upon coder review may  be revised to meet current compliance requirements. Dr. Ulyess Mort Lin Landsman MD, MD 12/20/2019 10:19:49 AM This report has been signed electronically. Number of Addenda: 0 Note Initiated On: 12/20/2019 9:50 AM Scope Withdrawal Time: 0 hours 11 minutes 36 seconds  Total Procedure Duration: 0 hours 20 minutes 26 seconds  Estimated Blood Loss:  Estimated blood loss: none.      [facility]

## 2019-12-20 NOTE — Anesthesia Preprocedure Evaluation (Signed)
Anesthesia Evaluation  Patient identified by MRN, date of birth, ID band Patient awake    Reviewed: Allergy & Precautions, H&P , NPO status , Patient's Chart, lab work & pertinent test results  History of Anesthesia Complications Negative for: history of anesthetic complications  Airway Mallampati: III  TM Distance: >3 FB Neck ROM: full    Dental  (+) Chipped   Pulmonary sleep apnea , COPD, former smoker,           Cardiovascular (-) angina(-) Past MI and (-) DOE negative cardio ROS       Neuro/Psych  Headaches, PSYCHIATRIC DISORDERS    GI/Hepatic Neg liver ROS, GERD  Medicated and Controlled,  Endo/Other  negative endocrine ROS  Renal/GU negative Renal ROS  negative genitourinary   Musculoskeletal   Abdominal   Peds  Hematology negative hematology ROS (+)   Anesthesia Other Findings Past Medical History: 1995: Bowel trouble 2012: Diffuse cystic mastopathy No date: Tension headache  Past Surgical History: No date: ABDOMINAL EXPLORATION SURGERY No date: ABDOMINAL HYSTERECTOMY 2010, 09/28/14: COLONOSCOPY     Comment:  DR. Elliott 2010: FOOT SURGERY; Left No date: TONSILLECTOMY AND ADENOIDECTOMY 1996: TOTAL ABDOMINAL HYSTERECTOMY W/ BILATERAL SALPINGOOPHORECTOMY     Comment:  Laparoscopically assisted for pelvic pain  BMI    Body Mass Index: 30.90 kg/m      Reproductive/Obstetrics negative OB ROS                             Anesthesia Physical Anesthesia Plan  ASA: III  Anesthesia Plan: General   Post-op Pain Management:    Induction: Intravenous  PONV Risk Score and Plan: Propofol infusion and TIVA  Airway Management Planned: Natural Airway and Nasal Cannula  Additional Equipment:   Intra-op Plan:   Post-operative Plan:   Informed Consent: I have reviewed the patients History and Physical, chart, labs and discussed the procedure including the risks, benefits  and alternatives for the proposed anesthesia with the patient or authorized representative who has indicated his/her understanding and acceptance.     Dental Advisory Given  Plan Discussed with: Anesthesiologist, CRNA and Surgeon  Anesthesia Plan Comments: (Patient consented for risks of anesthesia including but not limited to:  - adverse reactions to medications - risk of intubation if required - damage to teeth, lips or other oral mucosa - sore throat or hoarseness - Damage to heart, brain, lungs or loss of life  Patient voiced understanding.)        Anesthesia Quick Evaluation

## 2019-12-21 ENCOUNTER — Encounter: Payer: Self-pay | Admitting: Family Medicine

## 2019-12-21 ENCOUNTER — Encounter: Payer: Self-pay | Admitting: *Deleted

## 2019-12-21 DIAGNOSIS — K5730 Diverticulosis of large intestine without perforation or abscess without bleeding: Secondary | ICD-10-CM | POA: Insufficient documentation

## 2019-12-21 LAB — SURGICAL PATHOLOGY

## 2019-12-22 ENCOUNTER — Encounter: Payer: Self-pay | Admitting: Gastroenterology

## 2019-12-23 DIAGNOSIS — G4733 Obstructive sleep apnea (adult) (pediatric): Secondary | ICD-10-CM | POA: Diagnosis not present

## 2020-03-04 ENCOUNTER — Other Ambulatory Visit: Payer: Self-pay | Admitting: Family Medicine

## 2020-03-04 DIAGNOSIS — F17210 Nicotine dependence, cigarettes, uncomplicated: Secondary | ICD-10-CM

## 2020-03-04 NOTE — Telephone Encounter (Signed)
Requested  medications are  due for refill today yes  Requested medications are on the active medication list yes  Last refill 1/5  Future visit scheduled no  Notes to clinic failed protocol, visit more than 12 months ago

## 2020-04-03 ENCOUNTER — Ambulatory Visit: Payer: BC Managed Care – PPO | Admitting: Physician Assistant

## 2020-04-03 ENCOUNTER — Encounter: Payer: Self-pay | Admitting: Physician Assistant

## 2020-04-03 ENCOUNTER — Other Ambulatory Visit: Payer: Self-pay

## 2020-04-03 VITALS — BP 138/86 | HR 101 | Temp 96.0°F | Wt 188.8 lb

## 2020-04-03 DIAGNOSIS — Z8719 Personal history of other diseases of the digestive system: Secondary | ICD-10-CM

## 2020-04-03 DIAGNOSIS — R1032 Left lower quadrant pain: Secondary | ICD-10-CM

## 2020-04-03 MED ORDER — METRONIDAZOLE 500 MG PO TABS: 500.0000 mg | ORAL_TABLET | Freq: Three times a day (TID) | ORAL | 0 refills | Status: DC

## 2020-04-03 MED ORDER — CIPROFLOXACIN HCL 500 MG PO TABS: 500.0000 mg | ORAL_TABLET | Freq: Two times a day (BID) | ORAL | 0 refills | Status: AC

## 2020-04-03 NOTE — Patient Instructions (Signed)

## 2020-04-03 NOTE — Progress Notes (Signed)
Established patient visit   Patient: Theresa White   DOB: 1956/10/02   64 y.o. Female  MRN: [id number] Visit Date: 04/03/2020  Today's healthcare provider: Trinna Post, PA-C   Chief Complaint  Patient presents with  . Abdominal Pain  I,Adriana M Pollak,acting as a scribe for Trinna Post, PA-C.,have documented all relevant documentation on the behalf of Trinna Post, PA-C,as directed by  Trinna Post, PA-C while in the presence of Trinna Post, PA-C.  Subjective    Abdominal Pain This is a new problem. The current episode started in the past 7 days. The problem occurs constantly. The problem has been unchanged. Pain scale: with movement pain level is 7. The quality of the pain is cramping. The abdominal pain does not radiate. Associated symptoms include constipation and diarrhea. Pertinent negatives include no belching, headaches, nausea or vomiting. The pain is aggravated by certain positions and movement. The pain is relieved by nothing. She has tried nothing for the symptoms. The treatment provided no relief.  Patient has history diverticulitis of the colon and was treated with ciprofloxacin and flagyl back in 2019.       Medications: Outpatient Medications Prior to Visit  Medication Sig  . Ascorbic Acid (VITAMIN C PO) Take by mouth as directed.  Marland Kitchen aspirin 81 MG tablet Take 81 mg by mouth daily.  Marland Kitchen CALCIUM PO Take by mouth daily.  . cetirizine (ZYRTEC ALLERGY) 10 MG tablet Zyrtec 10 mg tablet   1 tablet every other day by oral route.  . Cyanocobalamin (VITAMIN B-12 PO) Take by mouth daily.  Marland Kitchen DEXILANT 60 MG capsule   . loratadine (CLARITIN) 10 MG tablet Take 10 mg by mouth daily.  . montelukast (SINGULAIR) 10 MG tablet Take 1 tablet (10 mg total) by mouth daily.  . Omega-3 Fatty Acids (FISH OIL PO) Take by mouth daily.  . Potassium 99 MG TABS Take 99 mg by mouth.  Marland Kitchen VITAMIN D, CHOLECALCIFEROL, PO Take 1 tablet by mouth daily.  . varenicline (CHANTIX  CONTINUING MONTH PAK) 1 MG tablet Take 1 tablet (1 mg total) by mouth 2 (two) times daily. (Patient not taking: Reported on 12/20/2019)   No facility-administered medications prior to visit.    Review of Systems  Gastrointestinal: Positive for abdominal pain, constipation and diarrhea. Negative for nausea and vomiting.  Neurological: Negative for headaches.      Objective    BP 138/86 (BP Location: Left Arm, Patient Position: Sitting, Cuff Size: Normal)   Pulse (!) 101   Temp (!) 96 F (35.6 C) (Temporal)   Wt 188 lb 12.8 oz (85.6 kg)   SpO2 95%   BMI 32.41 kg/m    Physical Exam Constitutional:      Appearance: She is well-developed.  Cardiovascular:     Rate and Rhythm: Normal rate and regular rhythm.  Pulmonary:     Effort: Pulmonary effort is normal.     Breath sounds: Normal breath sounds.  Abdominal:     General: Abdomen is flat. Bowel sounds are normal.     Palpations: Abdomen is soft.     Tenderness: There is abdominal tenderness in the left lower quadrant.  Skin:    General: Skin is warm and dry.  Neurological:     General: No focal deficit present.     Mental Status: She is alert and oriented to person, place, and time.  Psychiatric:        Mood and Affect: Mood normal.  Behavior: Behavior normal.       No results found for any visits on 04/03/20.  Assessment & Plan    1. LLQ pain  Will treat presumptively for diverticulitis. She is well appearing on exam. Advised to return if she is not improving and we will pursue CT abdomen and lab exams.   - ciprofloxacin (CIPRO) 500 MG tablet; Take 1 tablet (500 mg total) by mouth 2 (two) times daily for 7 days.  Dispense: 14 tablet; Refill: 0 - metroNIDAZOLE (FLAGYL) 500 MG tablet; Take 1 tablet (500 mg total) by mouth 3 (three) times daily.  Dispense: 21 tablet; Refill: 0  2. History of diverticulitis  - ciprofloxacin (CIPRO) 500 MG tablet; Take 1 tablet (500 mg total) by mouth 2 (two) times daily for 7  days.  Dispense: 14 tablet; Refill: 0   Return if symptoms worsen or fail to improve.      ITrinna Post, PA-C, have reviewed all documentation for this visit. The documentation on 04/03/20 for the exam, diagnosis, procedures, and orders are all accurate and complete.    Paulene Floor  [facility] [phone] (phone) [phone] (fax)  [facility]

## 2020-05-10 DIAGNOSIS — Z1231 Encounter for screening mammogram for malignant neoplasm of breast: Secondary | ICD-10-CM | POA: Diagnosis not present

## 2020-05-10 LAB — HM MAMMOGRAPHY

## 2020-05-15 DIAGNOSIS — M47812 Spondylosis without myelopathy or radiculopathy, cervical region: Secondary | ICD-10-CM | POA: Diagnosis not present

## 2020-06-01 DIAGNOSIS — M47812 Spondylosis without myelopathy or radiculopathy, cervical region: Secondary | ICD-10-CM | POA: Diagnosis not present

## 2020-06-14 DIAGNOSIS — G4733 Obstructive sleep apnea (adult) (pediatric): Secondary | ICD-10-CM | POA: Diagnosis not present

## 2020-09-14 ENCOUNTER — Other Ambulatory Visit: Payer: Self-pay | Admitting: *Deleted

## 2020-09-14 DIAGNOSIS — S46012A Strain of muscle(s) and tendon(s) of the rotator cuff of left shoulder, initial encounter: Secondary | ICD-10-CM | POA: Diagnosis not present

## 2020-09-14 DIAGNOSIS — Z87891 Personal history of nicotine dependence: Secondary | ICD-10-CM

## 2020-09-14 DIAGNOSIS — Z122 Encounter for screening for malignant neoplasm of respiratory organs: Secondary | ICD-10-CM

## 2020-09-14 NOTE — Progress Notes (Signed)
Current smoker, 33.75 pack year. Reports no change in insurance from past year.

## 2020-09-18 NOTE — Progress Notes (Signed)
Complete physical exam   Patient: Theresa White   DOB: Sep 26, 1956   64 y.o. Female  MRN: [id number] Visit Date: 09/19/2020  Today's healthcare provider: Lelon Huh, MD   Chief Complaint  Patient presents with  . Annual Exam   Subjective    Theresa White is a 64 y.o. female who presents today for a complete physical exam.  She reports consuming a general diet. Home exercise routine includes walking 2 times a week for 30-45 minutes. She generally feels fairly well. She reports sleeping fairly well. She does have additional problems to discuss today.  HPI  Shoulder pain: Patient had a fall on 09/09/2020 and hurt her left shoulder. She was seen by [facility] since the fall. She was told there was no fracture, but a possible tear of the shoulder. Patient was given home exercises try. Patient would like a second opinion on her shoulder pain. Pain worsens when performing active ROM exercises.   Past Medical History:  Diagnosis Date  . Bowel trouble 1995  . Diffuse cystic mastopathy 2012  . Tension headache    Past Surgical History:  Procedure Laterality Date  . ABDOMINAL EXPLORATION SURGERY    . COLONOSCOPY WITH PROPOFOL N/A 12/20/2019   Procedure: COLONOSCOPY WITH PROPOFOL;  Surgeon: Lin Landsman, MD;  Location: [facility] ENDOSCOPY;  Service: Gastroenterology;  Laterality: N/A;  . FOOT SURGERY Left 2010  . TONSILLECTOMY AND ADENOIDECTOMY    . TOTAL ABDOMINAL HYSTERECTOMY W/ BILATERAL SALPINGOOPHORECTOMY  1996   Laparoscopically assisted for pelvic pain   Social History   Socioeconomic History  . Marital status: Married    Spouse name: Not on file  . Number of children: 2  . Years of education: Not on file  . Highest education level: Not on file  Occupational History  . Occupation: Museum/gallery conservator  Tobacco Use  . Smoking status: Former Smoker    Packs/day: 1.00    Years: 25.00    Pack years: 25.00    Types: Cigarettes    Quit date: 09/16/2019    Years  since quitting: 1.0  . Smokeless tobacco: Never Used  . Tobacco comment: Started as teenager, averaging 1 ppd since  Vaping Use  . Vaping Use: Never used  Substance and Sexual Activity  . Alcohol use: No    Alcohol/week: 0.0 standard drinks  . Drug use: No  . Sexual activity: Not on file  Other Topics Concern  . Not on file  Social History Narrative  . Not on file   Social Determinants of Health   Financial Resource Strain:   . Difficulty of Paying Living Expenses: Not on file  Food Insecurity:   . Worried About Charity fundraiser in the Last Year: Not on file  . Ran Out of Food in the Last Year: Not on file  Transportation Needs:   . Lack of Transportation (Medical): Not on file  . Lack of Transportation (Non-Medical): Not on file  Physical Activity:   . Days of Exercise per Week: Not on file  . Minutes of Exercise per Session: Not on file  Stress:   . Feeling of Stress : Not on file  Social Connections:   . Frequency of Communication with Friends and Family: Not on file  . Frequency of Social Gatherings with Friends and Family: Not on file  . Attends Religious Services: Not on file  . Active Member of Clubs or Organizations: Not on file  . Attends Archivist  Meetings: Not on file  . Marital Status: Not on file  Intimate Partner Violence:   . Fear of Current or Ex-Partner: Not on file  . Emotionally Abused: Not on file  . Physically Abused: Not on file  . Sexually Abused: Not on file   Family Status  Relation Name Status  . Mother  Deceased at age 81  . Father  Deceased at age 35  . Sister  Deceased at age 11  . Sister  Deceased at age 78  . Brother  Alive  . Sister  Alive  . Brother  Alive   Family History  Problem Relation Age of Onset  . Kidney failure Mother   . Stroke Mother   . Diabetes Mother   . Emphysema Father   . Hypertension Father   . Aneurysm Sister        Cerebral  . Cervical cancer Sister 75  . Bladder Cancer Brother 23    Allergies  Allergen Reactions  . Codeine Itching    Patient Care Team: Birdie Sons, MD as PCP - General (Family Medicine) Christene Lye, MD (General Surgery) Carloyn Manner, MD as Referring Physician (Otolaryngology) Earnestine Leys, MD (Orthopedic Surgery) Lin Landsman, MD as Consulting Physician (Gastroenterology)   Medications: Outpatient Medications Prior to Visit  Medication Sig  . Ascorbic Acid (VITAMIN C PO) Take by mouth as directed.  Marland Kitchen aspirin 81 MG tablet Take 81 mg by mouth daily.  Marland Kitchen CALCIUM PO Take by mouth daily.  . cetirizine (ZYRTEC ALLERGY) 10 MG tablet Zyrtec 10 mg tablet   1 tablet every other day by oral route.  . Cyanocobalamin (VITAMIN B-12 PO) Take by mouth daily.  Marland Kitchen DEXILANT 60 MG capsule   . loratadine (CLARITIN) 10 MG tablet Take 10 mg by mouth daily.  . metroNIDAZOLE (FLAGYL) 500 MG tablet Take 1 tablet (500 mg total) by mouth 3 (three) times daily.  . montelukast (SINGULAIR) 10 MG tablet Take 1 tablet (10 mg total) by mouth daily.  . Omega-3 Fatty Acids (FISH OIL PO) Take by mouth daily.  . Potassium 99 MG TABS Take 99 mg by mouth.  . varenicline (CHANTIX CONTINUING MONTH PAK) 1 MG tablet Take 1 tablet (1 mg total) by mouth 2 (two) times daily. (Patient not taking: Reported on 12/20/2019)  . VITAMIN D, CHOLECALCIFEROL, PO Take 1 tablet by mouth daily.   No facility-administered medications prior to visit.    Review of Systems  Constitutional: Negative.  Negative for chills, fatigue and fever.  HENT: Negative.  Negative for congestion, ear pain, rhinorrhea, sneezing and sore throat.   Eyes: Negative.  Negative for pain and redness.  Respiratory: Negative.  Negative for cough, shortness of breath and wheezing.   Cardiovascular: Negative.  Negative for chest pain and leg swelling.  Gastrointestinal: Negative.  Negative for abdominal pain, blood in stool, constipation, diarrhea and nausea.  Endocrine: Negative.  Negative for  polydipsia and polyphagia.  Genitourinary: Negative.  Negative for dysuria, flank pain, hematuria, pelvic pain, vaginal bleeding and vaginal discharge.  Musculoskeletal: Positive for arthralgias (left shoulder pain). Negative for back pain, gait problem and joint swelling.  Skin: Negative.  Negative for rash.  Allergic/Immunologic: Negative.   Neurological: Negative.  Negative for dizziness, tremors, seizures, weakness, light-headedness, numbness and headaches.  Hematological: Negative.  Negative for adenopathy.  Psychiatric/Behavioral: Negative.  Negative for behavioral problems, confusion and dysphoric mood. The patient is not nervous/anxious and is not hyperactive.       Objective  There were no vitals taken for this visit.   Physical Exam   General Appearance:    Obese female. Alert, cooperative, in no acute distress, appears stated age   Head:    Normocephalic, without obvious abnormality, atraumatic  Eyes:    PERRL, conjunctiva/corneas clear, EOM's intact, fundi    benign, both eyes  Ears:    Normal TM's and external ear canals, both ears  Neck:   Supple, symmetrical, trachea midline, no adenopathy;    thyroid:  no enlargement/tenderness/nodules; no carotid   bruit or JVD  Back:     Symmetric, no curvature, ROM normal, no CVA tenderness  Lungs:     Clear to auscultation bilaterally, respirations unlabored  Chest Wall:    No tenderness or deformity   Heart:    Normal heart rate. Normal rhythm. No murmurs, rubs, or gallops.   Breast Exam:    normal appearance, no masses or tenderness  Abdomen:     Soft, non-tender, bowel sounds active all four quadrants,    no masses, no organomegaly  Pelvic:    deferred  Extremities:   All extremities are intact. No cyanosis or edema  Pulses:   2+ and symmetric all extremities  Skin:   Skin color, texture, turgor normal, no rashes or lesions  Lymph nodes:   Cervical, supraclavicular, and axillary nodes normal  Neurologic:   CNII-XII  intact, normal strength, sensation and reflexes    throughout    Last depression screening scores PHQ 2/9 Scores 09/13/2019 09/13/2019 06/11/2018  PHQ - 2 Score 0 0 0  PHQ- 9 Score 0 - 0   Last fall risk screening Fall Risk  09/13/2019  Falls in the past year? 0  Number falls in past yr: 0  Injury with Fall? 0   Last Audit-C alcohol use screening Alcohol Use Disorder Test (AUDIT) 09/13/2019  1. How often do you have a drink containing alcohol? 0  2. How many drinks containing alcohol do you have on a typical day when you are drinking? 0  3. How often do you have six or more drinks on one occasion? 0  AUDIT-C Score 0   A score of 3 or more in women, and 4 or more in men indicates increased risk for alcohol abuse, EXCEPT if all of the points are from question 1   No results found for any visits on 09/19/20.  Assessment & Plan    Routine Health Maintenance and Physical Exam  Exercise Activities and Dietary recommendations Goals   None      There is no immunization history on file for this patient.  Health Maintenance  Topic Date Due  . COVID-19 Vaccine (1) Never done  . HIV Screening  Never done  . TETANUS/TDAP  Never done  . INFLUENZA VACCINE  Never done  . MAMMOGRAM  05/10/2021  . COLONOSCOPY  12/19/2026  . Hepatitis C Screening  Completed    Discussed health benefits of physical activity, and encouraged her to engage in regular exercise appropriate for her age and condition.  1. Annual physical exam She declined all recommended vaccines. Strongly encouraged Covid vaccination.   2. Avitaminosis D  - VITAMIN D 25 Hydroxy (Vit-D Deficiency, Fractures)  3. Aortic atherosclerosis ([place]) Incidental finding on LDCT lung cancer screening. Lipids have been good, on daily ECASA. Encouraged smoking cessation, consider statin therapy, especially if she continues to smoke.  - CBC - Comprehensive metabolic panel - Lipid panel  4. Centrilobular emphysema  ([place]) Incidental finding on  LDCt, asymptomatic. Encouraged smoking cessation.   5. obesity  Encouraged healthy diet and regular exercise.  - TSH  6. Smoking greater than 30 pack years Did quit for several months on Chantix but is not back on about 3/4 ppd. Encourage her to continue smoking cessation. chantix has been recalled, given her a printed prescription that she can have filled when it is widely available.   LDCT scheduled tomorrow.   7. Strain of left shoulder, initial encounter Is followed at [facility]. reiterated importance of passive ROM exercises.   8. LUQ pain This is mild and intermittent. Not her bothering today. Is not sure if related to eating. Advised we could order an ultrasound if it becomes more persistent or severe.   mouth 2 (two) times daily.  Dispense: 60 tablet; Refill: 5   No follow-ups on file.     The entirety of the information documented in the History of Present Illness, Review of Systems and Physical Exam were personally obtained by me. Portions of this information were initially documented by the CMA and reviewed by me for thoroughness and accuracy.      Lelon Huh, MD  [facility] [phone] (phone) [phone] (fax)  [place]

## 2020-09-19 ENCOUNTER — Ambulatory Visit (INDEPENDENT_AMBULATORY_CARE_PROVIDER_SITE_OTHER): Payer: BC Managed Care – PPO | Admitting: Family Medicine

## 2020-09-19 ENCOUNTER — Other Ambulatory Visit: Payer: Self-pay

## 2020-09-19 ENCOUNTER — Encounter: Payer: Self-pay | Admitting: Family Medicine

## 2020-09-19 VITALS — BP 133/80 | HR 91 | Temp 98.5°F | Resp 16 | Wt 189.0 lb

## 2020-09-19 DIAGNOSIS — J432 Centrilobular emphysema: Secondary | ICD-10-CM | POA: Diagnosis not present

## 2020-09-19 DIAGNOSIS — E559 Vitamin D deficiency, unspecified: Secondary | ICD-10-CM

## 2020-09-19 DIAGNOSIS — Z6832 Body mass index (BMI) 32.0-32.9, adult: Secondary | ICD-10-CM

## 2020-09-19 DIAGNOSIS — I700 Atherosclerosis of aorta: Secondary | ICD-10-CM

## 2020-09-19 DIAGNOSIS — E669 Obesity, unspecified: Secondary | ICD-10-CM

## 2020-09-19 DIAGNOSIS — R1012 Left upper quadrant pain: Secondary | ICD-10-CM

## 2020-09-19 DIAGNOSIS — S46912A Strain of unspecified muscle, fascia and tendon at shoulder and upper arm level, left arm, initial encounter: Secondary | ICD-10-CM

## 2020-09-19 DIAGNOSIS — Z0000 Encounter for general adult medical examination without abnormal findings: Secondary | ICD-10-CM | POA: Diagnosis not present

## 2020-09-19 DIAGNOSIS — F17210 Nicotine dependence, cigarettes, uncomplicated: Secondary | ICD-10-CM

## 2020-09-19 MED ORDER — VARENICLINE TARTRATE 1 MG PO TABS: 1.0000 mg | ORAL_TABLET | Freq: Two times a day (BID) | ORAL | 5 refills | Status: DC

## 2020-09-19 NOTE — Patient Instructions (Signed)
.   Covid-19 vaccines: The Covid vaccines have been given to hundreds of millions of people and found to be very effective and are as safe as any other vaccine.  The Johnson [employer] Johnson vaccine has been associated with very rare dangerous blood clots, but only in adult women under the age of 60.  The risk of dying from Covid infections is much higher than having a serious reaction to the vaccine.  I strongly recommend getting fully vaccinated against Covid-19.  I recommend that adult women under 60 get fully vaccinated, but the Moderna and Pfizer vaccines may be safer for those women than the Johnson [employer] Johnson vaccine.   

## 2020-09-20 ENCOUNTER — Ambulatory Visit
Admission: RE | Admit: 2020-09-20 | Discharge: 2020-09-20 | Disposition: A | Discharge status: Home / Self Care | Payer: BC Managed Care – PPO | Source: Ambulatory Visit | Attending: Nurse Practitioner | Admitting: Nurse Practitioner

## 2020-09-20 ENCOUNTER — Other Ambulatory Visit: Payer: Self-pay

## 2020-09-20 ENCOUNTER — Ambulatory Visit: Payer: BC Managed Care – PPO

## 2020-09-20 DIAGNOSIS — Z87891 Personal history of nicotine dependence: Secondary | ICD-10-CM | POA: Diagnosis not present

## 2020-09-20 DIAGNOSIS — Z122 Encounter for screening for malignant neoplasm of respiratory organs: Secondary | ICD-10-CM | POA: Insufficient documentation

## 2020-09-20 DIAGNOSIS — F17210 Nicotine dependence, cigarettes, uncomplicated: Secondary | ICD-10-CM | POA: Diagnosis not present

## 2020-09-20 LAB — CBC
Hematocrit: 43.7 % (ref 34.0–46.6)
Hemoglobin: 14.5 g/dL (ref 11.1–15.9)
MCH: 31.8 pg (ref 26.6–33.0)
MCHC: 33.2 g/dL (ref 31.5–35.7)
MCV: 96 fL (ref 79–97)
Platelets: 357 10*3/uL (ref 150–450)
RBC: 4.56 x10E6/uL (ref 3.77–5.28)
RDW: 12.8 % (ref 11.7–15.4)
WBC: 10.1 10*3/uL (ref 3.4–10.8)

## 2020-09-20 LAB — COMPREHENSIVE METABOLIC PANEL
ALT: 10 IU/L (ref 0–32)
AST: 19 IU/L (ref 0–40)
Albumin/Globulin Ratio: 1.8 (ref 1.2–2.2)
Albumin: 4.3 g/dL (ref 3.8–4.8)
Alkaline Phosphatase: 156 IU/L — ABNORMAL HIGH (ref 44–121)
BUN/Creatinine Ratio: 17 (ref 12–28)
BUN: 15 mg/dL (ref 8–27)
Bilirubin Total: <0.2 mg/dL (ref 0.0–1.2)
CO2: 22 mmol/L (ref 20–29)
Calcium: 9.6 mg/dL (ref 8.7–10.3)
Chloride: 104 mmol/L (ref 96–106)
Creatinine, Ser: 0.87 mg/dL (ref 0.57–1.00)
GFR calc Af Amer: 81 mL/min/{1.73_m2} (ref >59)
GFR calc non Af Amer: 71 mL/min/{1.73_m2} (ref >59)
Globulin, Total: 2.4 g/dL (ref 1.5–4.5)
Glucose: 97 mg/dL (ref 65–99)
Potassium: 4.2 mmol/L (ref 3.5–5.2)
Sodium: 142 mmol/L (ref 134–144)
Total Protein: 6.7 g/dL (ref 6.0–8.5)

## 2020-09-20 LAB — LIPID PANEL
Chol/HDL Ratio: 3.1 ratio (ref 0.0–4.4)
Cholesterol, Total: 136 mg/dL (ref 100–199)
HDL: 44 mg/dL (ref >39)
LDL Chol Calc (NIH): 55 mg/dL (ref 0–99)
Triglycerides: 229 mg/dL — ABNORMAL HIGH (ref 0–149)
VLDL Cholesterol Cal: 37 mg/dL (ref 5–40)

## 2020-09-20 LAB — VITAMIN D 25 HYDROXY (VIT D DEFICIENCY, FRACTURES): Vit D, 25-Hydroxy: 53.6 ng/mL (ref 30.0–100.0)

## 2020-09-20 LAB — TSH: TSH: 3.78 u[IU]/mL (ref 0.450–4.500)

## 2020-09-28 ENCOUNTER — Encounter: Payer: Self-pay | Admitting: *Deleted

## 2020-10-30 ENCOUNTER — Telehealth: Payer: Self-pay | Admitting: Family Medicine

## 2020-10-30 ENCOUNTER — Ambulatory Visit: Payer: Self-pay | Admitting: *Deleted

## 2020-10-30 NOTE — Telephone Encounter (Signed)
Patient calling for advice after receiving positive results for COVID-19 home test on today. Patient states that she began to have symptoms of feeling really tired, cough, runny nose, congestion, "awful headache" on Saturday, 11/27. Patient states that she had a fever of 100.3 on Sunday and did take Tylenol. Criteria for ending self-isolation reviewed with patient.Patient given home care advice for symptom treatment. Understanding verbalized. Patient advised to return call to office for worsening symptoms.  Reason for Disposition . HIGH RISK for severe COVID complications (e.g., age > 22 years, obesity with BMI > 25, pregnant, chronic lung disease or other chronic medical condition)  (Exception: Already seen by PCP and no new or worsening symptoms.)  Answer Assessment - Initial Assessment Questions 1. COVID-19 DIAGNOSIS: "Who made your Coronavirus (COVID-19) diagnosis?" "Was it confirmed by a positive lab test?" If not diagnosed by a HCP, ask "Are there lots of cases (community spread) where you live?" (See public health department website, if unsure)     Patient was tested using home test this morning 2. COVID-19 EXPOSURE: "Was there any known exposure to COVID before the symptoms began?" CDC Definition of close contact: within 6 feet (2 meters) for a total of 15 minutes or more over a 24-hour period.      Not stated 3. ONSET: "When did the COVID-19 symptoms start?"      Saturday, 10/28/20 4. WORST SYMPTOM: "What is your worst symptom?" (e.g., cough, fever, shortness of breath, muscle aches)     Cough, headache,fever 5. COUGH: "Do you have a cough?" If Yes, ask: "How bad is the cough?"       yes 6. FEVER: "Do you have a fever?" If Yes, ask: "What is your temperature, how was it measured, and when did it start?"     Had a fever on yesterday, was 100.3 7. RESPIRATORY STATUS: "Describe your breathing?" (e.g., shortness of breath, wheezing, unable to speak)      No c/o difficulty breathing 8.  BETTER-SAME-WORSE: "Are you getting better, staying the same or getting worse compared to yesterday?"  If getting worse, ask, "In what way?"     same 9. HIGH RISK DISEASE: "Do you have any chronic medical problems?" (e.g., asthma, heart or lung disease, weak immune system, obesity, etc.)     Not assessed 10. PREGNANCY: "Is there any chance you are pregnant?" "When was your last menstrual period?"       n/a 11. OTHER SYMPTOMS: "Do you have any other symptoms?"  (e.g., chills, fatigue, headache, loss of smell or taste, muscle pain, sore throat; new loss of smell or taste especially support the diagnosis of COVID-19)       Patient reports that she has an awful headache, cough, congestion and feels really tired  Protocols used: CORONAVIRUS (COVID-19) DIAGNOSED OR SUSPECTED-A-AH

## 2020-10-30 NOTE — Telephone Encounter (Signed)
See triage encounter for 10/30/20.

## 2020-10-30 NOTE — Telephone Encounter (Unsigned)
Copied from [place] [phone]. Topic: General - Other >> Oct 30, 2020 11:12 AM Yvette Rack wrote: Reason for CRM: Pt stated she took a Covid test at home and it came out positive. Pt would like to know what the next step is and if a prescription will need to be sent to her pharmacy. Pt requests call back.

## 2020-10-30 NOTE — Telephone Encounter (Signed)
Has she been fully immunized. If not, and If symptoms have been present for 7 days or less then she should consider monoclonal antibody infusion. We can send referral for her if so.

## 2020-10-31 NOTE — Telephone Encounter (Addendum)
I called and spoke with patient. Her symptoms started either on 10/27/2020 or 10/28/2020. She couldn't tell me an exact date. She is interested in doing the antibody infusion. I contacted the referral team via secure chat for antibody infusion and was advised by team member Janine Ores, RN that patient will be added  to their list to be called and screened within the next 24-48 hours I called and advised patient of this.

## 2020-11-01 ENCOUNTER — Encounter: Payer: Self-pay | Admitting: Oncology

## 2020-11-01 ENCOUNTER — Telehealth: Payer: Self-pay | Admitting: Oncology

## 2020-11-01 ENCOUNTER — Other Ambulatory Visit: Payer: Self-pay | Admitting: Oncology

## 2020-11-01 DIAGNOSIS — U071 COVID-19: Secondary | ICD-10-CM

## 2020-11-01 NOTE — Telephone Encounter (Signed)
I connected by phone with  Mrs. Propst  to discuss the potential use of an new treatment for mild to moderate COVID-19 viral infection in non-hospitalized patients.   This patient is a age/sex that meets the FDA criteria for Emergency Use Authorization of casirivimab\imdevimab.  Has a (+) direct SARS-CoV-2 viral test result 1. Has mild or moderate COVID-19  2. Is ? 64 years of age and weighs ? 40 kg 3. Is NOT hospitalized due to COVID-19 4. Is NOT requiring oxygen therapy or requiring an increase in baseline oxygen flow rate due to COVID-19 5. Is within 10 days of symptom onset 6. Has at least one of the high risk factor(s) for progression to severe COVID-19 and/or hospitalization as defined in EUA. Specific high risk criteria : Past Medical History:  Diagnosis Date  . Bowel trouble 1995  . Diffuse cystic mastopathy 2012  . Tension headache   ? Obesity  Symptom onset 10/28/20   I have spoken and communicated the following to the patient or parent/caregiver:   1. FDA has authorized the emergency use of casirivimab\imdevimab for the treatment of mild to moderate COVID-19 in adults and pediatric patients with positive results of direct SARS-CoV-2 viral testing who are 61 years of age and older weighing at least 40 kg, and who are at high risk for progressing to severe COVID-19 and/or hospitalization.   2. The significant known and potential risks and benefits of casirivimab\imdevimab, and the extent to which such potential risks and benefits are unknown.   3. Information on available alternative treatments and the risks and benefits of those alternatives, including clinical trials.   4. Patients treated with casirivimab\imdevimab should continue to self-isolate and use infection control measures (e.g., wear mask, isolate, social distance, avoid sharing personal items, clean and disinfect "high touch" surfaces, and frequent handwashing) according to CDC guidelines.    5. The patient or  parent/caregiver has the option to accept or refuse casirivimab\imdevimab .   After reviewing this information with the patient, The patient agreed to proceed with receiving casirivimab\imdevimab infusion and will be provided a copy of the Fact sheet prior to receiving the infusion.Rulon Abide, AGNP-C [phone] ([place])

## 2020-11-02 ENCOUNTER — Ambulatory Visit (HOSPITAL_COMMUNITY)
Admission: RE | Admit: 2020-11-02 | Discharge: 2020-11-02 | Disposition: A | Discharge status: Home / Self Care | Payer: BC Managed Care – PPO | Source: Ambulatory Visit | Attending: Pulmonary Disease | Admitting: Pulmonary Disease

## 2020-11-02 ENCOUNTER — Other Ambulatory Visit (HOSPITAL_COMMUNITY): Payer: Self-pay

## 2020-11-02 DIAGNOSIS — U071 COVID-19: Secondary | ICD-10-CM | POA: Diagnosis not present

## 2020-11-02 MED ORDER — SOTROVIMAB 500 MG/8ML IV SOLN
500.0000 mg | Freq: Once | INTRAVENOUS | Status: AC
  Administered 2020-11-02: 500 mg via INTRAVENOUS

## 2020-11-02 MED ORDER — EPINEPHRINE 0.3 MG/0.3ML IJ SOAJ: 0.3000 mg | Freq: Once | INTRAMUSCULAR | Status: DC | PRN

## 2020-11-02 MED ORDER — METHYLPREDNISOLONE SODIUM SUCC 125 MG IJ SOLR: 125.0000 mg | Freq: Once | INTRAMUSCULAR | Status: DC | PRN

## 2020-11-02 MED ORDER — FAMOTIDINE IN NACL 20-0.9 MG/50ML-% IV SOLN: 20.0000 mg | Freq: Once | INTRAVENOUS | Status: DC | PRN

## 2020-11-02 MED ORDER — SODIUM CHLORIDE 0.9 % IV SOLN: INTRAVENOUS | Status: DC | PRN

## 2020-11-02 MED ORDER — DIPHENHYDRAMINE HCL 50 MG/ML IJ SOLN: 50.0000 mg | Freq: Once | INTRAMUSCULAR | Status: DC | PRN

## 2020-11-02 MED ORDER — ALBUTEROL SULFATE HFA 108 (90 BASE) MCG/ACT IN AERS: 2.0000 | INHALATION_SPRAY | Freq: Once | RESPIRATORY_TRACT | Status: DC | PRN

## 2020-11-02 NOTE — Progress Notes (Signed)
Diagnosis: COVID-19  Physician: Dr. Patrick Wright  Procedure: Covid Infusion Clinic Med: Sotrovimab infusion - Provided patient with sotrovimab fact sheet for patients, parents, and caregivers prior to infusion.   Complications: No immediate complications noted  Discharge: Discharged home    

## 2020-11-02 NOTE — Progress Notes (Signed)
Patient reviewed Fact Sheet for Patients, Parents, and Caregivers for Emergency Use Authorization (EUA) of Sotrovimab for the Treatment of Coronavirus. Patient also reviewed and is agreeable to the estimated cost of treatment. Patient is agreeable to proceed.   

## 2020-11-02 NOTE — Discharge Instructions (Signed)
10 Things You Can Do to Manage Your COVID-19 Symptoms at Home If you have possible or confirmed COVID-19: 1. Stay home from work and school. And stay away from other public places. If you must go out, avoid using any kind of public transportation, ridesharing, or taxis. 2. Monitor your symptoms carefully. If your symptoms get worse, call your healthcare provider immediately. 3. Get rest and stay hydrated. 4. If you have a medical appointment, call the healthcare provider ahead of time and tell them that you have or may have COVID-19. 5. For medical emergencies, call 911 and notify the dispatch personnel that you have or may have COVID-19. 6. Cover your cough and sneezes with a tissue or use the inside of your elbow. 7. Wash your hands often with soap and water for at least 20 seconds or clean your hands with an alcohol-based hand sanitizer that contains at least 60% alcohol. 8. As much as possible, stay in a specific room and away from other people in your home. Also, you should use a separate bathroom, if available. If you need to be around other people in or outside of the home, wear a mask. 9. Avoid sharing personal items with other people in your household, like dishes, towels, and bedding. 10. Clean all surfaces that are touched often, like counters, tabletops, and doorknobs. Use household cleaning sprays or wipes according to the label instructions. cdc.gov/coronavirus 06/02/2019 This information is not intended to replace advice given to you by your health care provider. Make sure you discuss any questions you have with your health care provider. Document Revised: 11/04/2019 Document Reviewed: 11/04/2019 Elsevier Patient Education  2020 Elsevier Inc. What types of side effects do monoclonal antibody drugs cause?  Common side effects  In general, the more common side effects caused by monoclonal antibody drugs include: . Allergic reactions, such as hives or itching . Flu-like signs and  symptoms, including chills, fatigue, fever, and muscle aches and pains . Nausea, vomiting . Diarrhea . Skin rashes . Low blood pressure   The CDC is recommending patients who receive monoclonal antibody treatments wait at least 90 days before being vaccinated.  Currently, there are no data on the safety and efficacy of mRNA COVID-19 vaccines in persons who received monoclonal antibodies or convalescent plasma as part of COVID-19 treatment. Based on the estimated half-life of such therapies as well as evidence suggesting that reinfection is uncommon in the 90 days after initial infection, vaccination should be deferred for at least 90 days, as a precautionary measure until additional information becomes available, to avoid interference of the antibody treatment with vaccine-induced immune responses. If you have any questions or concerns after the infusion please call the Advanced Practice Provider on call at [phone]. This number is ONLY intended for your use regarding questions or concerns about the infusion post-treatment side-effects.  Please do not provide this number to others for use. For return to work notes please contact your primary care provider.   If someone you know is interested in receiving treatment please have them call the COVID hotline at [phone].   

## 2021-01-05 ENCOUNTER — Encounter: Payer: Self-pay | Admitting: Family Medicine

## 2021-01-05 DIAGNOSIS — F17210 Nicotine dependence, cigarettes, uncomplicated: Secondary | ICD-10-CM

## 2021-01-05 MED ORDER — MONTELUKAST SODIUM 10 MG PO TABS: 10.0000 mg | ORAL_TABLET | Freq: Every day | ORAL | 3 refills | Status: DC

## 2021-01-05 MED ORDER — DEXILANT 60 MG PO CPDR
60.0000 mg | DELAYED_RELEASE_CAPSULE | Freq: Every day | ORAL | 3 refills | Status: DC
Start: 2021-01-05 — End: 2021-10-08

## 2021-01-05 NOTE — Telephone Encounter (Signed)
Pharmacy has been updated. Please review. Thanks!

## 2021-02-21 DIAGNOSIS — G4733 Obstructive sleep apnea (adult) (pediatric): Secondary | ICD-10-CM | POA: Diagnosis not present

## 2021-02-22 DIAGNOSIS — H02834 Dermatochalasis of left upper eyelid: Secondary | ICD-10-CM | POA: Diagnosis not present

## 2021-02-22 DIAGNOSIS — H5203 Hypermetropia, bilateral: Secondary | ICD-10-CM | POA: Diagnosis not present

## 2021-02-22 DIAGNOSIS — Z135 Encounter for screening for eye and ear disorders: Secondary | ICD-10-CM | POA: Diagnosis not present

## 2021-02-22 DIAGNOSIS — H52223 Regular astigmatism, bilateral: Secondary | ICD-10-CM | POA: Diagnosis not present

## 2021-02-22 DIAGNOSIS — H524 Presbyopia: Secondary | ICD-10-CM | POA: Diagnosis not present

## 2021-02-22 DIAGNOSIS — H02831 Dermatochalasis of right upper eyelid: Secondary | ICD-10-CM | POA: Diagnosis not present

## 2021-02-27 ENCOUNTER — Other Ambulatory Visit: Payer: Self-pay | Admitting: Family Medicine

## 2021-02-27 DIAGNOSIS — F17210 Nicotine dependence, cigarettes, uncomplicated: Secondary | ICD-10-CM

## 2021-04-12 DIAGNOSIS — H02834 Dermatochalasis of left upper eyelid: Secondary | ICD-10-CM | POA: Diagnosis not present

## 2021-04-12 DIAGNOSIS — H02831 Dermatochalasis of right upper eyelid: Secondary | ICD-10-CM | POA: Diagnosis not present

## 2021-05-16 DIAGNOSIS — Z1231 Encounter for screening mammogram for malignant neoplasm of breast: Secondary | ICD-10-CM | POA: Diagnosis not present

## 2021-06-01 HISTORY — PX: BLEPHAROPLASTY: SUR158

## 2021-06-26 DIAGNOSIS — J309 Allergic rhinitis, unspecified: Secondary | ICD-10-CM | POA: Diagnosis not present

## 2021-06-26 DIAGNOSIS — E669 Obesity, unspecified: Secondary | ICD-10-CM | POA: Diagnosis not present

## 2021-06-26 DIAGNOSIS — H57813 Brow ptosis, bilateral: Secondary | ICD-10-CM | POA: Diagnosis not present

## 2021-06-26 DIAGNOSIS — J439 Emphysema, unspecified: Secondary | ICD-10-CM | POA: Diagnosis not present

## 2021-06-26 DIAGNOSIS — Z6834 Body mass index (BMI) 34.0-34.9, adult: Secondary | ICD-10-CM | POA: Diagnosis not present

## 2021-06-26 DIAGNOSIS — K219 Gastro-esophageal reflux disease without esophagitis: Secondary | ICD-10-CM | POA: Diagnosis not present

## 2021-06-26 DIAGNOSIS — H02831 Dermatochalasis of right upper eyelid: Secondary | ICD-10-CM | POA: Diagnosis not present

## 2021-06-26 DIAGNOSIS — H02834 Dermatochalasis of left upper eyelid: Secondary | ICD-10-CM | POA: Diagnosis not present

## 2021-08-13 ENCOUNTER — Ambulatory Visit
Admission: EM | Admit: 2021-08-13 | Discharge: 2021-08-13 | Disposition: A | Discharge status: Home / Self Care | Payer: PPO | Attending: Emergency Medicine | Admitting: Emergency Medicine

## 2021-08-13 ENCOUNTER — Other Ambulatory Visit: Payer: Self-pay

## 2021-08-13 ENCOUNTER — Encounter: Payer: Self-pay | Admitting: Emergency Medicine

## 2021-08-13 ENCOUNTER — Ambulatory Visit: Payer: Self-pay | Admitting: *Deleted

## 2021-08-13 DIAGNOSIS — B349 Viral infection, unspecified: Secondary | ICD-10-CM

## 2021-08-13 DIAGNOSIS — H65191 Other acute nonsuppurative otitis media, right ear: Secondary | ICD-10-CM

## 2021-08-13 MED ORDER — FLUTICASONE PROPIONATE 50 MCG/ACT NA SUSP: 2.0000 | Freq: Every day | NASAL | 0 refills | Status: DC

## 2021-08-13 NOTE — Telephone Encounter (Signed)
Reason for Disposition . [1] MODERATE dizziness (e.g., interferes with normal activities) AND [2] has NOT been evaluated by physician for this  (Exception: dizziness caused by heat exposure, sudden standing, or poor fluid intake)  Answer Assessment - Initial Assessment Questions 1. DESCRIPTION: "Describe your dizziness."     Pt called in.  Once day last wk I had pain behind right ear.   It was tender to touch for a couple of days.  It went down my neck then next day it was fine. Thurs. When I woke up and sit on side of bed it felt like someone pushed me down.   I've had vertigo.   This feels different like I'm in a bubble.   I'm not nauseated.   Just dizzy when get up and it gets better as the day goes on.    I'm pulling towards the left and stubbling to the left.   I have to push against things to my left to keep from falling.   I'm feeling better than I was.   I'm not leaning to the left now.   I've not passed out. 2. LIGHTHEADED: "Do you feel lightheaded?" (e.g., somewhat faint, woozy, weak upon standing)     I'm just dizzy .     3. VERTIGO: "Do you feel like either you or the room is spinning or tilting?" (i.e. vertigo)     I have a history of vertigo.   This feels different than vertigo.   I've had a bad sinus infection.   I'm having headaches and my nose is stuffy and sometimes runny.   Yesterday I used nasal spray and it helped some.   I'm very congested this morning.   No sore throat.  I'm having post nasal drainage.   No coughing.    No fever.   I'm checking it. 4. SEVERITY: "How bad is it?"  "Do you feel like you are going to faint?" "Can you stand and walk?"   - MILD: Feels slightly dizzy, but walking normally.   - MODERATE: Feels unsteady when walking, but not falling; interferes with normal activities (e.g., school, work).   - SEVERE: Unable to walk without falling, or requires assistance to walk without falling; feels like passing out now.      Mild this morning. 5. ONSET:  "When did  the dizziness begin?"     Last Thurs. 6. AGGRAVATING FACTORS: "Does anything make it worse?" (e.g., standing, change in head position)     Moving my head and nasal congestion. 7. HEART RATE: "Can you tell me your heart rate?" "How many beats in 15 seconds?"  (Note: not all patients can do this)       Not asked 8. CAUSE: "What do you think is causing the dizziness?"     Either sinus infection or my ears are stopped up.     9. RECURRENT SYMPTOM: "Have you had dizziness before?" If Yes, ask: "When was the last time?" "What happened that time?"     Yes vertigo 10. OTHER SYMPTOMS: "Do you have any other symptoms?" (e.g., fever, chest pain, vomiting, diarrhea, bleeding)       See above  11. PREGNANCY: "Is there any chance you are pregnant?" "When was your last menstrual period?"       N/A  Protocols used: Dizziness - Lightheadedness-A-AH

## 2021-08-13 NOTE — ED Provider Notes (Signed)
CHIEF COMPLAINT:   Chief Complaint  Patient presents with   Dizziness   Nasal Congestion   Headache     SUBJECTIVE/HPI:   Dizziness Associated symptoms: headaches   Headache Associated symptoms: dizziness   A very pleasant 65 y.o.Female presents today with soreness behind the right ear which occurred once last week and has been felt to the right side of her neck.  She also developed dizziness that seems to resolve throughout the day.  Patient states that she will notice herself "pulling to the left" at times.  Patient also reports intermittent headaches and nasal congestion.  Patient reports negative COVID-19 test at home.  No known sick contacts.  Has been taking some over-the-counter medication without relief. Patient does not report any shortness of breath, chest pain, palpitations, visual changes, weakness, tingling, nausea, vomiting, diarrhea, fever, chills.   has a past medical history of Bowel trouble (1995), Diffuse cystic mastopathy (2012), and Tension headache.  ROS:  Review of Systems  Neurological:  Positive for dizziness and headaches.  See Subjective/HPI Medications, Allergies and Problem List personally reviewed in Epic today OBJECTIVE:   Vitals:   08/13/21 1124  BP: (!) 144/80  Pulse: 92  Temp: 98.7 F (37.1 C)  SpO2: 95%    Physical Exam   General: Appears well-developed and well-nourished. No acute distress.  HEENT Head: Normocephalic and atraumatic.  Ears: Hearing grossly intact, no drainage or visible deformity.  Bulging to right TM with fluid effusion noted.  Left TM WNL.  No erythema bilaterally to TMs. Nose: No nasal deviation.  Mouth/Throat: No stridor or tracheal deviation.  Non erythematous posterior pharynx noted with clear drainage present.  No white patchy exudate noted. Eyes: Conjunctivae and EOM are normal. No eye drainage or scleral icterus bilaterally.  Neck: Normal range of motion, neck is supple. No cervical, tonsillar or submandibular  lymph nodes palpated.   Cardiovascular: Normal rate. Regular rhythm; no murmurs, gallops, or rubs.  Pulm/Chest: No respiratory distress. Breath sounds normal bilaterally without wheezes, rhonchi, or rales.  Neurological: Alert and oriented to person, place, and time.  Skin: Skin is warm and dry.  No rashes, lesions, abrasions or bruising noted to skin.   Psychiatric: Normal mood, affect, behavior, and thought content.   Vital signs and nursing note reviewed.   Patient stable and cooperative with examination. PROCEDURES:    LABS/X-RAYS/EKG/MEDS:   No results found for any visits on 08/13/21.  MEDICAL DECISION MAKING:   Patient presents with soreness behind the right ear which occurred once last week and has been felt to the right side of her neck.  She also developed dizziness that seems to resolve throughout the day.  Patient states that she will notice herself "pulling to the left" at times.  Patient also reports intermittent headaches and nasal congestion.  Patient reports negative COVID-19 test at home.  No known sick contacts.  Has been taking some over-the-counter medication without relief. Patient does not report any shortness of breath, chest pain, palpitations, visual changes, weakness, tingling, nausea, vomiting, diarrhea, fever, chills.  Chart review completed.  COVID-19 PCR obtained in clinic today.  Likely cause of the patient's dizziness is right ear effusion.  Advised use of Dramamine.  Also Rx'd fluticasone nasal spray to the patient's preferred pharmacy.  No concern at this time for underlying bacterial etiology which would require antibiotics.  Likely, viral illness.  Rest, fluids, Mucinex versus Sudafed, Tylenol versus ibuprofen.  Return to clinic for new high fever not improving with medications,  chest pain, difficulty breathing, nonstop vomiting or coughing up blood.  Patient verbalized understanding and agreed with treatment plan.  Patient stable upon  discharge. ASSESSMENT/PLAN:  1. Viral illness - fluticasone (FLONASE) 50 MCG/ACT nasal spray; Place 2 sprays into both nostrils daily for 10 days.  Dispense: 9.9 mL; Refill: 0  2. Acute effusion of right ear - fluticasone (FLONASE) 50 MCG/ACT nasal spray; Place 2 sprays into both nostrils daily for 10 days.  Dispense: 9.9 mL; Refill: 0 Instructions about new medications and side effects provided.  Plan:   Discharge Instructions      Use your nasal spray as prescribed.  Begin using over the counter non-drowsy dramamine as directed  We will call you with any positive results from your COVID-19 testing completed in clinic today.  If you do not receive a phone call from [place] within the next 2-3 days, check your MyChart for up-to-date health information related to testing completed in clinic today.  For most people this is a self-limiting process and can take anywhere from 7 - 10 days to start feeling better. Pay special attention to handwashing as this can help prevent the spread of the virus.   Always read the labels of cough and cold medications as they may contain some of the ingredients below.  Rest, push lots of fluids (especially water), and utilize supportive care for symptoms. You may take acetaminophen (Tylenol) every 4-6 hours and ibuprofen every 6-8 hours for muscle pain, joint pain, headaches (you may also alternate these medications). Mucinex (guaifenesin) may be taken over the counter for cough as needed can loosen phlegm. Please read the instructions and take as directed.  Sudafed (pseudophedrine) is sold behind the counter and can help reduce nasal pressure; avoid taking this if you have high blood pressure or feel jittery. Sudafed PE (phenylephrine) can be a helpful, short-term, over-the-counter alternative to limit side effects or if you have high blood pressure.  Saline nasal sprays or rinses can also help nasal congestion (use bottled or sterile water). Warm tea with  lemon and honey can sooth sore throat and cough, as can cough drops.   Return to clinic for high fever not improving with medications, chest pain, difficulty breathing, non-stop vomiting, or coughing blood. Follow-up with your primary care provider if symptoms do not improve as expected in the next 5-7 days.          Serafina Royals, [place] 08/13/21 1212

## 2021-08-13 NOTE — ED Triage Notes (Signed)
Pt complains of soreness behind right ear once last week. The next day she developed dizziness that resolves throughout the day. She also has intermittent HA and nasal congestion.

## 2021-08-13 NOTE — Discharge Instructions (Signed)
Use your nasal spray as prescribed.  Begin using over the counter non-drowsy dramamine as directed  We will call you with any positive results from your COVID-19 testing completed in clinic today.  If you do not receive a phone call from [place] within the next 2-3 days, check your MyChart for up-to-date health information related to testing completed in clinic today.  For most people this is a self-limiting process and can take anywhere from 7 - 10 days to start feeling better. Pay special attention to handwashing as this can help prevent the spread of the virus.   Always read the labels of cough and cold medications as they may contain some of the ingredients below.  Rest, push lots of fluids (especially water), and utilize supportive care for symptoms. You may take acetaminophen (Tylenol) every 4-6 hours and ibuprofen every 6-8 hours for muscle pain, joint pain, headaches (you may also alternate these medications). Mucinex (guaifenesin) may be taken over the counter for cough as needed can loosen phlegm. Please read the instructions and take as directed.  Sudafed (pseudophedrine) is sold behind the counter and can help reduce nasal pressure; avoid taking this if you have high blood pressure or feel jittery. Sudafed PE (phenylephrine) can be a helpful, short-term, over-the-counter alternative to limit side effects or if you have high blood pressure.  Saline nasal sprays or rinses can also help nasal congestion (use bottled or sterile water). Warm tea with lemon and honey can sooth sore throat and cough, as can cough drops.   Return to clinic for high fever not improving with medications, chest pain, difficulty breathing, non-stop vomiting, or coughing blood. Follow-up with your primary care provider if symptoms do not improve as expected in the next 5-7 days.

## 2021-08-13 NOTE — Telephone Encounter (Signed)
Advised patient there is no available appts today and she will go to urgent care for treatment. kw

## 2021-08-13 NOTE — Telephone Encounter (Signed)
Please Review

## 2021-08-13 NOTE — Telephone Encounter (Signed)
Pt called in c/o feeling dizzy especially first thing in the morning since last Thur.   She is also having sinus congestion and pressure.   She has had dizziness in the past with sinus infections but it's been a while.   She also is having pain behind her right ear and down into her neck glands too.   She feels like she is in a bubble.   She has a history of vertigo but this feels different than that.  I'm sending a message to [facility] to see if they can schedule her with Edward Jolly, NP.   Pt was agreeable to being called back.

## 2021-08-22 DIAGNOSIS — G4733 Obstructive sleep apnea (adult) (pediatric): Secondary | ICD-10-CM | POA: Diagnosis not present

## 2021-08-28 ENCOUNTER — Encounter: Payer: Self-pay | Admitting: Family Medicine

## 2021-09-16 ENCOUNTER — Other Ambulatory Visit: Payer: Self-pay | Admitting: Family Medicine

## 2021-09-16 DIAGNOSIS — F17210 Nicotine dependence, cigarettes, uncomplicated: Secondary | ICD-10-CM

## 2021-09-17 ENCOUNTER — Encounter: Payer: Self-pay | Admitting: Family Medicine

## 2021-09-17 DIAGNOSIS — F17210 Nicotine dependence, cigarettes, uncomplicated: Secondary | ICD-10-CM

## 2021-09-25 ENCOUNTER — Ambulatory Visit: Payer: PPO | Admitting: Family Medicine

## 2021-10-01 NOTE — Telephone Encounter (Signed)
CT scan was done 09/20/2021.

## 2021-10-08 ENCOUNTER — Telehealth: Payer: Self-pay | Admitting: Family Medicine

## 2021-10-08 DIAGNOSIS — F17210 Nicotine dependence, cigarettes, uncomplicated: Secondary | ICD-10-CM

## 2021-10-08 MED ORDER — DEXILANT 60 MG PO CPDR
60.0000 mg | DELAYED_RELEASE_CAPSULE | Freq: Every day | ORAL | 1 refills | Status: DC
Start: 2021-10-08 — End: 2022-04-04

## 2021-10-08 MED ORDER — MONTELUKAST SODIUM 10 MG PO TABS: 10.0000 mg | ORAL_TABLET | Freq: Every day | ORAL | 1 refills | Status: DC

## 2021-10-08 NOTE — Addendum Note (Signed)
Addended by: Ashley Royalty E on: 10/08/2021 04:05 PM   Modules accepted: Orders

## 2021-10-08 NOTE — Telephone Encounter (Signed)
[facility] faxed refill request for the following medications:  montelukast (SINGULAIR) 10 MG tablet   DEXILANT 60 MG capsule   Please advise.

## 2021-10-10 ENCOUNTER — Ambulatory Visit (INDEPENDENT_AMBULATORY_CARE_PROVIDER_SITE_OTHER): Payer: PPO | Admitting: Family Medicine

## 2021-10-10 ENCOUNTER — Other Ambulatory Visit: Payer: Self-pay

## 2021-10-10 ENCOUNTER — Encounter: Payer: Self-pay | Admitting: Family Medicine

## 2021-10-10 VITALS — BP 124/80 | HR 80 | Temp 98.6°F | Ht 63.5 in | Wt 196.0 lb

## 2021-10-10 DIAGNOSIS — F17210 Nicotine dependence, cigarettes, uncomplicated: Secondary | ICD-10-CM

## 2021-10-10 DIAGNOSIS — R9431 Abnormal electrocardiogram [ECG] [EKG]: Secondary | ICD-10-CM

## 2021-10-10 DIAGNOSIS — I700 Atherosclerosis of aorta: Secondary | ICD-10-CM | POA: Diagnosis not present

## 2021-10-10 DIAGNOSIS — J432 Centrilobular emphysema: Secondary | ICD-10-CM | POA: Diagnosis not present

## 2021-10-10 DIAGNOSIS — E2839 Other primary ovarian failure: Secondary | ICD-10-CM | POA: Diagnosis not present

## 2021-10-10 DIAGNOSIS — Z0000 Encounter for general adult medical examination without abnormal findings: Secondary | ICD-10-CM | POA: Diagnosis not present

## 2021-10-10 DIAGNOSIS — Z131 Encounter for screening for diabetes mellitus: Secondary | ICD-10-CM

## 2021-10-10 DIAGNOSIS — K219 Gastro-esophageal reflux disease without esophagitis: Secondary | ICD-10-CM

## 2021-10-10 DIAGNOSIS — Z136 Encounter for screening for cardiovascular disorders: Secondary | ICD-10-CM

## 2021-10-10 DIAGNOSIS — E559 Vitamin D deficiency, unspecified: Secondary | ICD-10-CM | POA: Diagnosis not present

## 2021-10-10 NOTE — Progress Notes (Signed)
Medicare Initial Preventative Physical Exam    Patient: Theresa White, Female    DOB: 03/07/1956, 65 y.o.   MRN: [id number] Visit Date: 10/10/2021  Today's Provider: Lelon Huh, MD   Chief Complaint  Patient presents with   Medicare Wellness    Subjective    Medicare Initial Preventative Physical Exam Theresa White is a 65 y.o. female who presents today for her Initial Preventative Physical Exam.   HPI  Social History   Socioeconomic History   Marital status: Married    Spouse name: Not on file   Number of children: 2   Years of education: Not on file   Highest education level: Not on file  Occupational History   Occupation: Production planning  Tobacco Use   Smoking status: Former    Packs/day: 0.75    Years: 25.00    Pack years: 18.75    Types: Cigarettes    Quit date: 11/01/2020    Years since quitting: 0.9   Smokeless tobacco: Never   Tobacco comments:    Started as teenager, averaging 1 ppd since  Vaping Use   Vaping Use: Never used  Substance and Sexual Activity   Alcohol use: No    Alcohol/week: 0.0 standard drinks   Drug use: No   Sexual activity: Not on file  Other Topics Concern   Not on file  Social History Narrative   Not on file   Social Determinants of Health   Financial Resource Strain: Not on file  Food Insecurity: Not on file  Transportation Needs: Not on file  Physical Activity: Not on file  Stress: Not on file  Social Connections: Not on file  Intimate Partner Violence: Not on file    Past Medical History:  Diagnosis Date   Bowel trouble 1995   Diffuse cystic mastopathy 2012   Tension headache      Patient Active Problem List   Diagnosis Date Noted   Diverticulosis of sigmoid colon 12/21/2019   Shortened PR interval 09/13/2019   Aortic atherosclerosis ([place]) 06/24/2018   Emphysema lung ([place]) 06/24/2018   GERD (gastroesophageal reflux disease)    Abnormal mammogram 03/18/2016   Smoking greater than 30 pack years  04/12/2015   Personal history of colonic polyps 09/20/2014   Diffuse cystic mastopathy 02/17/2013   Sleep apnea    Allergic rhinitis 12/05/2009   Post menopausal syndrome 12/05/2009   Avitaminosis D 05/17/2009   Anxiety disorder 11/01/2008   Acid reflux 07/27/2007   Headache, tension-type 07/27/2007   Compulsive tobacco user syndrome 07/27/2007   Cannot sleep 07/24/2007    Past Surgical History:  Procedure Laterality Date   ABDOMINAL EXPLORATION SURGERY     BLEPHAROPLASTY Bilateral 06/2021   COLONOSCOPY WITH PROPOFOL N/A 12/20/2019   Procedure: COLONOSCOPY WITH PROPOFOL;  Surgeon: Lin Landsman, MD;  Location: [facility] ENDOSCOPY;  Service: Gastroenterology;  Laterality: N/A;   FOOT SURGERY Left 2010   TONSILLECTOMY AND ADENOIDECTOMY     TOTAL ABDOMINAL HYSTERECTOMY W/ BILATERAL SALPINGOOPHORECTOMY  1996   Laparoscopically assisted for pelvic pain    Her family history includes Aneurysm in her sister; Bladder Cancer (age of onset: 64) in her brother; Cervical cancer (age of onset: 69) in her sister; Diabetes in her mother; Emphysema in her father; Healthy in her brother and sister; Hypertension in her father; Kidney failure in her mother; Stroke in her mother. There is no history of Breast cancer.   Current Outpatient Medications:    Ascorbic Acid (VITAMIN C PO),  Take by mouth as directed., Disp: , Rfl:    aspirin 81 MG tablet, Take 81 mg by mouth daily., Disp: , Rfl:    CALCIUM PO, Take by mouth daily., Disp: , Rfl:    cetirizine (ZYRTEC) 10 MG tablet, Zyrtec 10 mg tablet   1 tablet every other day by oral route., Disp: , Rfl:    Cyanocobalamin (VITAMIN B-12 PO), Take by mouth daily., Disp: , Rfl:    DEXILANT 60 MG capsule, Take 1 capsule (60 mg total) by mouth daily., Disp: 90 capsule, Rfl: 1   fluticasone (FLONASE) 50 MCG/ACT nasal spray, Place 2 sprays into both nostrils daily for 10 days., Disp: 9.9 mL, Rfl: 0   loratadine (CLARITIN) 10 MG tablet, Take 10 mg by mouth  daily., Disp: , Rfl:    montelukast (SINGULAIR) 10 MG tablet, Take 1 tablet (10 mg total) by mouth daily., Disp: 90 tablet, Rfl: 1   Omega-3 Fatty Acids (FISH OIL PO), Take by mouth daily., Disp: , Rfl:    Potassium 99 MG TABS, Take 99 mg by mouth., Disp: , Rfl:    varenicline (CHANTIX CONTINUING MONTH PAK) 1 MG tablet, Take 1 tablet (1 mg total) by mouth 2 (two) times daily., Disp: 60 tablet, Rfl: 5   VITAMIN D, CHOLECALCIFEROL, PO, Take 1 tablet by mouth daily., Disp: , Rfl:    Patient Care Team: Birdie Sons, MD as PCP - General (Family Medicine) Christene Lye, MD (General Surgery) Carloyn Manner, MD as Referring Physician (Otolaryngology) Earnestine Leys, MD (Orthopedic Surgery) Lin Landsman, MD as Consulting Physician (Gastroenterology)  Review of Systems  Constitutional: Negative.   HENT: Negative.    Eyes: Negative.   Respiratory:  Positive for apnea. Negative for cough, choking, chest tightness, shortness of breath, wheezing and stridor.   Cardiovascular: Negative.   Gastrointestinal: Negative.   Endocrine: Negative.   Genitourinary: Negative.   Musculoskeletal: Negative.   Skin: Negative.   Allergic/Immunologic: Negative.   Neurological: Negative.   Hematological: Negative.   Psychiatric/Behavioral: Negative.       Objective    Vitals: BP 124/80 (BP Location: Right Arm, Patient Position: Sitting, Cuff Size: Large)   Pulse 80   Temp 98.6 F (37 C) (Oral)   Ht 5' 3.5" (1.613 m)   Wt 196 lb (88.9 kg)   SpO2 100%   BMI 34.18 kg/m  Vision Screening   Right eye Left eye Both eyes  Without correction 20/20 20/20 20/15   With correction      Physical Exam   General Appearance:    Obese female. Alert, cooperative, in no acute distress, appears stated age   Head:    Normocephalic, without obvious abnormality, atraumatic  Eyes:    PERRL, conjunctiva/corneas clear, EOM's intact, fundi    benign, both eyes  Ears:    Normal TM's and external ear  canals, both ears  Nose:   Nares normal, septum midline, mucosa normal, no drainage    or sinus tenderness  Throat:   Lips, mucosa, and tongue normal; teeth and gums normal  Neck:   Supple, symmetrical, trachea midline, no adenopathy;    thyroid:  no enlargement/tenderness/nodules; no carotid   bruit or JVD  Back:     Symmetric, no curvature, ROM normal, no CVA tenderness  Lungs:     Clear to auscultation bilaterally, respirations unlabored  Chest Wall:    No tenderness or deformity   Heart:    Normal heart rate. Normal rhythm. No murmurs, rubs, or gallops.  Breast Exam:    normal appearance, no masses or tenderness  Abdomen:     Soft, non-tender, bowel sounds active all four quadrants,    no masses, no organomegaly  Pelvic:    deferred  Extremities:   All extremities are intact. No cyanosis or edema  Pulses:   2+ and symmetric all extremities  Skin:   Skin color, texture, turgor normal, no rashes or lesions  Lymph nodes:   Cervical, supraclavicular, and axillary nodes normal  Neurologic:   CNII-XII intact, normal strength, sensation and reflexes    throughout     Activities of Daily Living No flowsheet data found.  Fall Risk Assessment Fall Risk  09/19/2020 09/19/2020 09/13/2019 06/10/2017  Falls in the past year? 1 1 0 No  Number falls in past yr: 0 - 0 -  Injury with Fall? 1 - 0 -  Follow up Falls evaluation completed - - -     Depression Screen PHQ 2/9 Scores 09/19/2020 09/13/2019 09/13/2019 06/11/2018  PHQ - 2 Score 0 0 0 0  PHQ- 9 Score 0 0 - 0    6CIT Screen 10/10/2021  What Year? 0 points  What month? 0 points  What time? 0 points  Count back from 20 0 points  Months in reverse 0 points  Repeat phrase 0 points  Total Score 0    No results found for any visits on 10/10/21.  Assessment & Plan      Initial Preventative Physical Exam  Reviewed patient's Family Medical History Reviewed and updated list of patient's medical providers Assessment of cognitive  impairment was done Assessed patient's functional ability Established a written schedule for health screening [place] Completed and Reviewed  Exercise Activities and Dietary recommendations  Goals   None      There is no immunization history on file for this patient.  Health Maintenance  Topic Date Due   COVID-19 Vaccine (1) Never done   HIV Screening  Never done   TETANUS/TDAP  Never done   Zoster Vaccines- Shingrix (1 of 2) Never done   DEXA SCAN  Never done   MAMMOGRAM  05/10/2021   INFLUENZA VACCINE  03/01/2022 (Originally 07/02/2021)   Pneumonia Vaccine 57+ Years old (1 - PCV) 10/10/2022 (Originally 01/04/1962)   COLONOSCOPY (Pts 45-29yrs Insurance coverage will need to be confirmed)  12/19/2026   Hepatitis C Screening  Completed   HPV VACCINES  Aged Out     Discussed health benefits of physical activity, and encouraged her to engage in regular exercise appropriate for her age and condition.   1. Smoking greater than 30 pack years Has quit smoking since having had Covid - Ambulatory Referral Lung Cancer Screening [facility]  2. Estrogen deficiency  - DG Bone Density  3. Gastroesophageal reflux disease, unspecified whether esophagitis present Well controlled.  Continue current medications.     4. Avitaminosis D  - VITAMIN D 25 Hydroxy (Vit-D Deficiency, Fractures)  5. Encounter for special screening examination for cardiovascular disorder  - Lipid panel  6. Aortic atherosclerosis (HCC) Asymptomatic. Compliant with medication.  Continue aggressive risk factor modification.  Has stopped smoking. Consider statin.   7. Diabetes mellitus screening  - Glucose, random  8. Centrilobular emphysema ([place]) Has quit smoking. Asymptomatic.   9. Welcome to Medicare preventive visit  - EKG 12-Lead        The entirety of the information documented in the History of Present Illness, Review of Systems and Physical Exam were personally  obtained by me. Portions of this information were initially documented by the CMA and reviewed by me for thoroughness and accuracy.     Lelon Huh, MD  [facility] [phone] (phone) [phone] (fax)  [place]

## 2021-10-10 NOTE — Patient Instructions (Addendum)
Please review the attached list of medications and notify my office if there are any errors.   I recommend that you get the Prevnar-20 vaccine to protect yourself from certain strains of pneumonia. Please call our office at [phone] at your earliest convenience to schedule this vaccine.

## 2021-10-11 DIAGNOSIS — H02831 Dermatochalasis of right upper eyelid: Secondary | ICD-10-CM | POA: Diagnosis not present

## 2021-10-11 LAB — LIPID PANEL
Chol/HDL Ratio: 2.6 ratio (ref 0.0–4.4)
Cholesterol, Total: 130 mg/dL (ref 100–199)
HDL: 50 mg/dL (ref >39)
LDL Chol Calc (NIH): 50 mg/dL (ref 0–99)
Triglycerides: 183 mg/dL — ABNORMAL HIGH (ref 0–149)
VLDL Cholesterol Cal: 30 mg/dL (ref 5–40)

## 2021-10-11 LAB — VITAMIN D 25 HYDROXY (VIT D DEFICIENCY, FRACTURES): Vit D, 25-Hydroxy: 56.2 ng/mL (ref 30.0–100.0)

## 2021-10-11 LAB — GLUCOSE, RANDOM: Glucose: 103 mg/dL — ABNORMAL HIGH (ref 70–99)

## 2021-10-23 DIAGNOSIS — M8588 Other specified disorders of bone density and structure, other site: Secondary | ICD-10-CM | POA: Diagnosis not present

## 2021-10-23 DIAGNOSIS — E2839 Other primary ovarian failure: Secondary | ICD-10-CM | POA: Diagnosis not present

## 2021-10-23 LAB — HM DEXA SCAN: HM Dexa Scan: NORMAL

## 2021-10-29 ENCOUNTER — Telehealth: Payer: Self-pay

## 2021-10-29 NOTE — Telephone Encounter (Signed)
Copied from [place] [phone]. Topic: General - Inquiry >> Oct 29, 2021  3:19 PM Oneta Rack wrote: Patient received a EOB reflecting she owes $30 for her physical dated for 08/13/2021. Patient states insurance implied it might have been coded incorrectly. Patient would like a follow up call

## 2021-10-29 NOTE — Telephone Encounter (Addendum)
Can this be resubmitted associating the EKG with with aortic atherosclerosis (i70.0) and short PR interval (R94.31)

## 2021-10-30 ENCOUNTER — Other Ambulatory Visit: Payer: Self-pay | Admitting: *Deleted

## 2021-10-30 DIAGNOSIS — F17210 Nicotine dependence, cigarettes, uncomplicated: Secondary | ICD-10-CM

## 2021-10-30 DIAGNOSIS — Z87891 Personal history of nicotine dependence: Secondary | ICD-10-CM

## 2021-10-30 NOTE — Telephone Encounter (Signed)
Spoke with patient regarding her message about owing $30.00  I do not see where she owes a copay.

## 2021-11-09 ENCOUNTER — Ambulatory Visit
Admission: RE | Admit: 2021-11-09 | Discharge: 2021-11-09 | Disposition: A | Discharge status: Home / Self Care | Payer: PPO | Source: Ambulatory Visit | Attending: Acute Care | Admitting: Acute Care

## 2021-11-09 ENCOUNTER — Other Ambulatory Visit: Payer: Self-pay

## 2021-11-09 DIAGNOSIS — Z87891 Personal history of nicotine dependence: Secondary | ICD-10-CM | POA: Insufficient documentation

## 2021-11-09 DIAGNOSIS — F17210 Nicotine dependence, cigarettes, uncomplicated: Secondary | ICD-10-CM | POA: Diagnosis not present

## 2021-11-21 ENCOUNTER — Other Ambulatory Visit: Payer: Self-pay | Admitting: Acute Care

## 2021-11-21 DIAGNOSIS — Z87891 Personal history of nicotine dependence: Secondary | ICD-10-CM

## 2021-11-21 DIAGNOSIS — F17210 Nicotine dependence, cigarettes, uncomplicated: Secondary | ICD-10-CM

## 2021-12-31 ENCOUNTER — Encounter: Payer: Self-pay | Admitting: Family Medicine

## 2022-01-01 ENCOUNTER — Ambulatory Visit: Payer: Self-pay

## 2022-01-01 ENCOUNTER — Other Ambulatory Visit: Payer: Self-pay

## 2022-01-01 MED ORDER — NIRMATRELVIR/RITONAVIR (PAXLOVID)TABLET: 3.0000 | ORAL_TABLET | Freq: Two times a day (BID) | ORAL | 0 refills | Status: DC

## 2022-01-01 MED ORDER — MOLNUPIRAVIR EUA 200MG CAPSULE: 4.0000 | ORAL_CAPSULE | Freq: Two times a day (BID) | ORAL | 0 refills | Status: AC

## 2022-01-01 NOTE — Telephone Encounter (Signed)
Summary: FU refused anti viral today/ help with another reccomendation by pharmacy   Pls fu with pt as Theresa White states that she does not qualify for antivirals referred to in earlier CC this am due to lab work requirements not being done at [facility]. They are stating the dr would need to order Molnupibar. Pls fu with pt again to purse this route. FU [phone]          Second triage call. See above request. Please advise pt.

## 2022-01-01 NOTE — Addendum Note (Signed)
Addended by: Birdie Sons on: 01/01/2022 05:17 PM   Modules accepted: Orders

## 2022-01-01 NOTE — Telephone Encounter (Signed)
Pt tested positive for covid and has a cough, body aches, headaches, hip pain, stuffy nose / her symptoms started yesterday / please advise / pt sent a mychart message asking for the covid medication to be sent to the pharmacy     Chief Complaint: COVID positive Symptoms: Cough, headache,fever,body aches, congestion Frequency: 2 days ago. Asking for anti-viral. Pertinent Negatives: Patient denies SOB Disposition: [] ED /[] Urgent Care (no appt availability in office) / [] Appointment(In office/virtual)/ []  [place] [facility]/ [] Home Care/ [] Refused Recommended Disposition /[] [place] [facility]/ [x]  Follow-up with PCP Additional Notes: Pt. Will call [place]. Pharmacy for anti-viral medication.   Answer Assessment - Initial Assessment Questions 1. COVID-19 DIAGNOSIS: "Who made your COVID-19 diagnosis?" "Was it confirmed by a positive lab test or self-test?" If not diagnosed by a doctor (or NP/PA), ask "Are there lots of cases (community spread) where you live?" Note: See public health department website, if unsure.     Home test 2. COVID-19 EXPOSURE: "Was there any known exposure to COVID before the symptoms began?" CDC Definition of close contact: within 6 feet (2 meters) for a total of 15 minutes or more over a 24-hour period.      No 3. ONSET: "When did the COVID-19 symptoms start?"      2 days ago 4. WORST SYMPTOM: "What is your worst symptom?" (e.g., cough, fever, shortness of breath, muscle aches)     Cough 5. COUGH: "Do you have a cough?" If Yes, ask: "How bad is the cough?"       Yes 6. FEVER: "Do you have a fever?" If Yes, ask: "What is your temperature, how was it measured, and when did it start?"     Yes - 99.9 7. RESPIRATORY STATUS: "Describe your breathing?" (e.g., shortness of breath, wheezing, unable to speak)      No 8. BETTER-SAME-WORSE: "Are you getting better, staying the same or getting worse compared to yesterday?"  If getting worse, ask, "In what way?"      Worse  9. HIGH RISK DISEASE: "Do you have any chronic medical problems?" (e.g., asthma, heart or lung disease, weak immune system, obesity, etc.)     COPD 10. VACCINE: "Have you had the COVID-19 vaccine?" If Yes, ask: "Which one, how many shots, when did you get it?"       N/A 11. BOOSTER: "Have you received your COVID-19 booster?" If Yes, ask: "Which one and when did you get it?"       N/A 12. PREGNANCY: "Is there any chance you are pregnant?" "When was your last menstrual period?"       No 13. OTHER SYMPTOMS: "Do you have any other symptoms?"  (e.g., chills, fatigue, headache, loss of smell or taste, muscle pain, sore throat)       Headache, fatigue, body aches 14. O2 SATURATION MONITOR:  "Do you use an oxygen saturation monitor (pulse oximeter) at home?" If Yes, ask "What is your reading (oxygen level) today?" "What is your usual oxygen saturation reading?" (e.g., 95%)       No  Protocols used: Coronavirus (COVID-19) Diagnosed or Suspected-A-AH

## 2022-01-04 ENCOUNTER — Encounter: Payer: Self-pay | Admitting: Family Medicine

## 2022-01-04 DIAGNOSIS — R059 Cough, unspecified: Secondary | ICD-10-CM

## 2022-01-04 MED ORDER — BENZONATATE 200 MG PO CAPS: 200.0000 mg | ORAL_CAPSULE | Freq: Two times a day (BID) | ORAL | 0 refills | Status: DC | PRN

## 2022-01-18 DIAGNOSIS — L821 Other seborrheic keratosis: Secondary | ICD-10-CM | POA: Diagnosis not present

## 2022-02-11 ENCOUNTER — Other Ambulatory Visit: Payer: Self-pay

## 2022-02-11 DIAGNOSIS — K219 Gastro-esophageal reflux disease without esophagitis: Secondary | ICD-10-CM

## 2022-02-11 NOTE — Telephone Encounter (Signed)
I returned [employer] call. I was advised that Dexilant is not covered and a previous prior authorization on file has been denied. The alternatives include:  ?-dexlansoprazole ?-esomeprazole ?-omeprazole ?Lansoprazole.  ? ?[employer] wants to know what medications she has tried and failed. They will be sending [place] a form to complete for this medication and whether it can be changed to one of the covered alternatives.  ?

## 2022-02-11 NOTE — Telephone Encounter (Signed)
Copied from [place] [phone]. Topic: General - Other ?>> Feb 11, 2022 12:16 PM Pawlus, Monica A wrote: ?Reason for CRM: Caller from [employer], the pts insurance, wanted more information regarding DEXILANT 60 MG capsule and if the pt has tried and failed other medications prior to being given this, please advise. ?

## 2022-02-11 NOTE — Addendum Note (Signed)
Addended by: Birdie Sons on: 02/11/2022 05:35 PM ? ? Modules accepted: Orders ? ?

## 2022-02-11 NOTE — Telephone Encounter (Signed)
Can change to dexlansoprazole 60. May sent to pharmacy of her choice.  ?

## 2022-02-12 ENCOUNTER — Telehealth: Payer: Self-pay | Admitting: Family Medicine

## 2022-02-12 MED ORDER — DEXLANSOPRAZOLE 60 MG PO CPDR: 60.0000 mg | DELAYED_RELEASE_CAPSULE | Freq: Every day | ORAL | 4 refills | Status: DC

## 2022-02-12 NOTE — Telephone Encounter (Signed)
Theresa White is calling for [employer] . Stating he needs a code for the pt appeal .. okay to leave a voice mail on his direct line  ? ?[phone] ? ?

## 2022-02-13 NOTE — Telephone Encounter (Signed)
Tried returning Theresa White's call. Left detailed message on voice message system.   ?

## 2022-02-13 NOTE — Telephone Encounter (Signed)
Theresa White is calling back to follow up. ? ?Time-sensitive only has until 5 o?clock tomorrow night. ? ?Fax- [phone] ?

## 2022-02-13 NOTE — Telephone Encounter (Signed)
I returned Kevin's call. I was advised that patient requested an appeal for brand name Dexilant. Lennette Bihari wanted to know which medications has she tried and failed. I reviewed patient's chart and advised Lennette Bihari that she has tried Nexium and Protonix in the past. Lennette Bihari advised me that he will process patient's appeal with this information.  ?

## 2022-02-13 NOTE — Telephone Encounter (Signed)
Lennette Bihari called back in, but it looks like the office had gone to lunch, since no one had picked up, please advise. ?

## 2022-02-13 NOTE — Telephone Encounter (Signed)
He also mentioned, some more information he needed was if the pt tried any alternative medications and they failed. Please advise.  ?

## 2022-03-21 DIAGNOSIS — M79672 Pain in left foot: Secondary | ICD-10-CM | POA: Diagnosis not present

## 2022-03-21 DIAGNOSIS — M722 Plantar fascial fibromatosis: Secondary | ICD-10-CM | POA: Diagnosis not present

## 2022-03-21 DIAGNOSIS — M7672 Peroneal tendinitis, left leg: Secondary | ICD-10-CM | POA: Diagnosis not present

## 2022-03-21 DIAGNOSIS — M7732 Calcaneal spur, left foot: Secondary | ICD-10-CM | POA: Diagnosis not present

## 2022-04-01 DIAGNOSIS — M654 Radial styloid tenosynovitis [de Quervain]: Secondary | ICD-10-CM | POA: Diagnosis not present

## 2022-04-01 DIAGNOSIS — G5603 Carpal tunnel syndrome, bilateral upper limbs: Secondary | ICD-10-CM | POA: Diagnosis not present

## 2022-04-04 ENCOUNTER — Ambulatory Visit (INDEPENDENT_AMBULATORY_CARE_PROVIDER_SITE_OTHER): Payer: PPO | Admitting: Family Medicine

## 2022-04-04 ENCOUNTER — Encounter: Payer: Self-pay | Admitting: Family Medicine

## 2022-04-04 VITALS — BP 143/85 | HR 73 | Temp 97.6°F | Resp 16 | Ht 63.0 in | Wt 192.5 lb

## 2022-04-04 DIAGNOSIS — J449 Chronic obstructive pulmonary disease, unspecified: Secondary | ICD-10-CM | POA: Insufficient documentation

## 2022-04-04 DIAGNOSIS — K5792 Diverticulitis of intestine, part unspecified, without perforation or abscess without bleeding: Secondary | ICD-10-CM | POA: Diagnosis not present

## 2022-04-04 MED ORDER — METRONIDAZOLE 500 MG PO TABS: 500.0000 mg | ORAL_TABLET | Freq: Three times a day (TID) | ORAL | 0 refills | Status: AC

## 2022-04-04 MED ORDER — MONTELUKAST SODIUM 10 MG PO TABS: 10.0000 mg | ORAL_TABLET | Freq: Every day | ORAL | 3 refills | Status: DC

## 2022-04-04 MED ORDER — CIPROFLOXACIN HCL 500 MG PO TABS: 500.0000 mg | ORAL_TABLET | Freq: Two times a day (BID) | ORAL | 0 refills | Status: AC

## 2022-04-04 NOTE — Progress Notes (Signed)
?  ?I,Tiffany J Bragg,acting as a scribe for Gwyneth Sprout, FNP.,have documented all relevant documentation on the behalf of Gwyneth Sprout, FNP,as directed by  Gwyneth Sprout, FNP while in the presence of Gwyneth Sprout, FNP.  ? ?Established patient visit ? ? ?Patient: Theresa White   DOB: May 30, 1956   66 y.o. Female  MRN: [id number] ?Visit Date: 04/04/2022 ? ?Today's healthcare provider: Gwyneth Sprout, FNP  ?Introduced to Designer, jewellery role and practice setting.  All questions answered.  Discussed provider/patient relationship and expectations. ? ? ?Chief Complaint  ?Patient presents with  ? Abdominal Pain  ?  Patient complains of L flank pain for 6 days. States she is concerned it is diverticulitis.   ? ?Subjective  ?  ?Abdominal Pain ? ?HPI   ? ? Abdominal Pain   ? Additional comments: Patient complains of L flank pain for 6 days. States she is concerned it is diverticulitis.  ? ?  ?  ?Last edited by Smitty Knudsen, CMA on 04/04/2022  3:49 PM.  ?  ?  ? ?Medications: ?Outpatient Medications Prior to Visit  ?Medication Sig  ? aspirin 81 MG tablet Take 81 mg by mouth daily.  ? benzonatate (TESSALON) 200 MG capsule Take 1 capsule (200 mg total) by mouth 2 (two) times daily as needed for cough.  ? CALCIUM PO Take by mouth daily.  ? cetirizine (ZYRTEC) 10 MG tablet Zyrtec 10 mg tablet ?  1 tablet every other day by oral route.  ? Cyanocobalamin (VITAMIN B-12 PO) Take by mouth daily.  ? dexlansoprazole (DEXILANT) 60 MG capsule Take 1 capsule (60 mg total) by mouth daily.  ? loratadine (CLARITIN) 10 MG tablet Take 10 mg by mouth daily.  ? Omega-3 Fatty Acids (FISH OIL PO) Take by mouth daily.  ? Potassium 99 MG TABS Take 99 mg by mouth.  ? VITAMIN D, CHOLECALCIFEROL, PO Take 1 tablet by mouth daily.  ? [DISCONTINUED] Ascorbic Acid (VITAMIN C PO) Take by mouth as directed.  ? [DISCONTINUED] DEXILANT 60 MG capsule Take 1 capsule (60 mg total) by mouth daily.  ? [DISCONTINUED] montelukast (SINGULAIR) 10 MG tablet Take 1  tablet (10 mg total) by mouth daily.  ? fluticasone (FLONASE) 50 MCG/ACT nasal spray Place 2 sprays into both nostrils daily for 10 days.  ? [DISCONTINUED] varenicline (CHANTIX CONTINUING MONTH PAK) 1 MG tablet Take 1 tablet (1 mg total) by mouth 2 (two) times daily. (Patient not taking: Reported on 04/04/2022)  ? ?No facility-administered medications prior to visit.  ? ? ?Review of Systems  ?Gastrointestinal:  Positive for abdominal pain.  ? ? ?  Objective  ?  ?BP (!) 143/85 (BP Location: Left Arm, Patient Position: Sitting, Cuff Size: Large)   Pulse 73   Temp 97.6 ?F (36.4 ?C) (Oral)   Resp 16   Ht '5\' 3"'$  (1.6 m)   Wt 192 lb 8 oz (87.3 kg)   SpO2 98%   BMI 34.10 kg/m?  ? ? ?Physical Exam ?Vitals and nursing note reviewed.  ?Constitutional:   ?   General: She is not in acute distress. ?   Appearance: Normal appearance. She is well-developed. She is obese. She is not ill-appearing, toxic-appearing or diaphoretic.  ?HENT:  ?   Head: Normocephalic and atraumatic.  ?Cardiovascular:  ?   Rate and Rhythm: Normal rate and regular rhythm.  ?   Pulses: Normal pulses.  ?   Heart sounds: Normal heart sounds. No murmur heard. ?  No friction rub. No gallop.  ?Pulmonary:  ?   Effort: Pulmonary effort is normal. No respiratory distress.  ?   Breath sounds: Normal breath sounds. No stridor. No wheezing, rhonchi or rales.  ?Chest:  ?   Chest wall: No tenderness.  ?Abdominal:  ?   General: Bowel sounds are normal. There is distension.  ?   Palpations: Abdomen is soft.  ?   Tenderness: There is generalized abdominal tenderness and tenderness in the left upper quadrant. There is no right CVA tenderness, left CVA tenderness, guarding or rebound.  ?   Hernia: No hernia is present.  ?Musculoskeletal:     ?   General: No swelling, tenderness, deformity or signs of injury. Normal range of motion.  ?   Right lower leg: No edema.  ?   Left lower leg: No edema.  ?Skin: ?   General: Skin is warm and dry.  ?   Capillary Refill: Capillary  refill takes less than 2 seconds.  ?   Coloration: Skin is not jaundiced or pale.  ?   Findings: No bruising, erythema, lesion or rash.  ?Neurological:  ?   General: No focal deficit present.  ?   Mental Status: She is alert and oriented to person, place, and time. Mental status is at baseline.  ?   Cranial Nerves: No cranial nerve deficit.  ?   Sensory: No sensory deficit.  ?   Motor: No weakness.  ?   Coordination: Coordination normal.  ?Psychiatric:     ?   Mood and Affect: Mood normal. Mood is not anxious or depressed.     ?   Behavior: Behavior normal.     ?   Thought Content: Thought content normal.     ?   Judgment: Judgment normal.  ?  ? ?No results found for any visits on 04/04/22. ? Assessment & Plan  ?  ? ?Problem List Items Addressed This Visit   ? ?  ? Respiratory  ? Chronic obstructive pulmonary disease (HCC)  ?  Chronic, stable ?Due for refill of Singulair ?Denies further complaints at this time ? ?  ?  ? Relevant Medications  ? montelukast (SINGULAIR) 10 MG tablet  ?  ? Other  ? Diverticulitis - Primary  ?  Acute, <1 week hx, BP slightly elevated at this time ?Will send for ABX- dual therapy ?Encourage probiotic as well, Ex Culturelle ?Has been taking Naproxen for foot pain- could be elevating BP or adding to abdominal pain; however, patient has taken with food ?RTC in 2 wks if symptoms continue ? ?  ?  ? Relevant Medications  ? ciprofloxacin (CIPRO) 500 MG tablet  ? metroNIDAZOLE (FLAGYL) 500 MG tablet  ? ? ? ?Return in about 2 weeks (around 04/18/2022), or if symptoms worsen or fail to improve.  ?   ? ?I, Gwyneth Sprout, FNP, have reviewed all documentation for this visit. The documentation on 04/04/22 for the exam, diagnosis, procedures, and orders are all accurate and complete. ? ? ? ?Gwyneth Sprout, FNP  ?[place] ?[phone] (phone) ?[phone] (fax) ? ?[facility]  ?

## 2022-04-04 NOTE — Assessment & Plan Note (Signed)
Acute, <1 week hx, BP slightly elevated at this time ?Will send for ABX- dual therapy ?Encourage probiotic as well, Ex Culturelle ?Has been taking Naproxen for foot pain- could be elevating BP or adding to abdominal pain; however, patient has taken with food ?RTC in 2 wks if symptoms continue ?

## 2022-04-04 NOTE — Assessment & Plan Note (Signed)
Chronic, stable ?Due for refill of Singulair ?Denies further complaints at this time ?

## 2022-05-22 ENCOUNTER — Encounter: Payer: Self-pay | Admitting: Family Medicine

## 2022-05-22 DIAGNOSIS — Z1231 Encounter for screening mammogram for malignant neoplasm of breast: Secondary | ICD-10-CM | POA: Diagnosis not present

## 2022-05-30 ENCOUNTER — Encounter: Payer: Self-pay | Admitting: Family Medicine

## 2022-10-14 NOTE — Progress Notes (Unsigned)
I,Roshena L Chambers,acting as a scribe for Lelon Huh, MD.,have documented all relevant documentation on the behalf of Lelon Huh, MD,as directed by  Lelon Huh, MD while in the presence of Lelon Huh, MD.    Complete Physical Exam      Patient: Theresa White, Female    DOB: 1955/12/16, 66 y.o.   MRN: [id number] Visit Date: 10/15/2022  Today's Provider: Lelon Huh, MD    Subjective    Theresa White is a 66 y.o. female who presents today for her complete physical examination.  She reports consuming a general diet. The patient does not participate in regular exercise at present. She generally feels fairly well. She reports sleeping fairly well. She does not have additional problems to discuss today.   HPI Follow up for GERD:  The patient was last seen for this 1  year  ago. Changes made at last visit include none; continue same medication.  She reports good compliance with treatment. She feels that condition is Unchanged. She has no GI symptoms as long as she takes dexlansoprazole every day.  She is not having side effects.   -----------------------------------------------------------------------------------------  She states she does see dermatologist occasionally and was last seen earlier this year.   Medications: Outpatient Medications Prior to Visit  Medication Sig   ascorbic acid (VITAMIN C) 1000 MG tablet Take 1,000 mg by mouth daily.   aspirin 81 MG tablet Take 81 mg by mouth daily.   CALCIUM PO Take by mouth daily.   cetirizine (ZYRTEC) 10 MG tablet Zyrtec 10 mg tablet   1 tablet every other day by oral route.   Cholecalciferol (VITAMIN D) 125 MCG (5000 UT) CAPS Take 1 capsule by mouth daily.   Cyanocobalamin (VITAMIN B-12 PO) Take by mouth daily.   dexlansoprazole (DEXILANT) 60 MG capsule Take 1 capsule (60 mg total) by mouth daily.   loratadine (CLARITIN) 10 MG tablet Take 10 mg by mouth daily.   montelukast (SINGULAIR) 10 MG tablet Take 1  tablet (10 mg total) by mouth daily.   Potassium 99 MG TABS Take 99 mg by mouth.   [DISCONTINUED] VITAMIN D, CHOLECALCIFEROL, PO Take 1 tablet by mouth daily.   [DISCONTINUED] benzonatate (TESSALON) 200 MG capsule Take 1 capsule (200 mg total) by mouth 2 (two) times daily as needed for cough. (Patient not taking: Reported on 10/15/2022)   [DISCONTINUED] fluticasone (FLONASE) 50 MCG/ACT nasal spray Place 2 sprays into both nostrils daily for 10 days.   [DISCONTINUED] Omega-3 Fatty Acids (FISH OIL PO) Take by mouth daily. (Patient not taking: Reported on 10/15/2022)   No facility-administered medications prior to visit.    Allergies  Allergen Reactions   Codeine Itching    Patient Care Team: Birdie Sons, MD as PCP - General (Family Medicine) Christene Lye, MD (General Surgery) Carloyn Manner, MD as Referring Physician (Otolaryngology) Earnestine Leys, MD (Orthopedic Surgery) Lin Landsman, MD as Consulting Physician (Gastroenterology)  Review of Systems  Constitutional:  Negative for appetite change, chills, fatigue and fever.  HENT:  Negative for congestion, ear pain, rhinorrhea, sneezing and sore throat.   Eyes: Negative.  Negative for pain and redness.  Respiratory:  Positive for apnea. Negative for cough, chest tightness, shortness of breath and wheezing.   Cardiovascular:  Negative for chest pain, palpitations and leg swelling.  Gastrointestinal:  Negative for abdominal pain, blood in stool, constipation, diarrhea, nausea and vomiting.  Endocrine: Negative for polydipsia and polyphagia.  Genitourinary: Negative.  Negative for  dysuria, flank pain, hematuria, pelvic pain, vaginal bleeding and vaginal discharge.  Musculoskeletal:  Positive for back pain, myalgias and neck pain. Negative for arthralgias, gait problem and joint swelling.  Skin:  Negative for rash.  Neurological: Negative.  Negative for dizziness, tremors, seizures, weakness, light-headedness,  numbness and headaches.  Hematological:  Negative for adenopathy.  Psychiatric/Behavioral: Negative.  Negative for behavioral problems, confusion and dysphoric mood. The patient is not nervous/anxious and is not hyperactive.         Objective    Vitals: BP 131/71 (BP Location: Right Arm, Patient Position: Sitting, Cuff Size: Large)   Pulse 78   Temp 98 F (36.7 C) (Oral)   Resp 14   Ht '5\' 3"'$  (1.6 m)   Wt 190 lb (86.2 kg)   SpO2 99% Comment: room air  BMI 33.66 kg/m     Physical Exam  General Appearance:    Obese female. Alert, cooperative, in no acute distress, appears stated age   Head:    Normocephalic, without obvious abnormality, atraumatic  Eyes:    PERRL, conjunctiva/corneas clear, EOM's intact, fundi    benign, both eyes  Ears:    Normal TM's and external ear canals, both ears  Nose:   Nares normal, septum midline, mucosa normal, no drainage    or sinus tenderness  Throat:   Lips, mucosa, and tongue normal; teeth and gums normal  Neck:   Supple, symmetrical, trachea midline, no adenopathy;    thyroid:  no enlargement/tenderness/nodules; no carotid   bruit or JVD  Back:     Symmetric, no curvature, ROM normal, no CVA tenderness  Lungs:     Clear to auscultation bilaterally, respirations unlabored  Chest Wall:    No tenderness or deformity   Heart:    Normal heart rate. Normal rhythm. No murmurs, rubs, or gallops.   Breast Exam:    normal appearance, no masses or tenderness  Abdomen:     Soft, non-tender, bowel sounds active all four quadrants,    no masses, no organomegaly  Pelvic:    deferred  Extremities:   All extremities are intact. No cyanosis or edema  Pulses:   2+ and symmetric all extremities  Skin:   Skin color, texture, turgor normal, no rashes or lesions  Lymph nodes:   Cervical, supraclavicular, and axillary nodes normal  Neurologic:   CNII-XII intact, normal strength, sensation and reflexes    throughout      Assessment & Plan     1. Annual  physical exam   2. Gastroesophageal reflux disease, unspecified whether esophagitis present Well controlled on current PPI which she needs to take every day  3. Aortic atherosclerosis (HCC)  - Comprehensive metabolic panel - CBC - Lipid panel  4. Chronic obstructive pulmonary disease, unspecified COPD type ([place]) Has stopped smoking. Is asymptomatic.   5. Stopped smoking with greater than 30 pack year history Scheduled for LDCT in December.   6. Avitaminosis D  - VITAMIN D 25 Hydroxy (Vit-D Deficiency, Fractures)  7. Need for vaccination against Streptococcus pneumoniae She declined recommended pneumococcal vaccine.     .  The entirety of the information documented in the History of Present Illness, Review of Systems and Physical Exam were personally obtained by me. Portions of this information were initially documented by the CMA and reviewed by me for thoroughness and accuracy.     Lelon Huh, MD  [facility] [phone] (phone) [phone] (fax)  [place]

## 2022-10-15 ENCOUNTER — Ambulatory Visit (INDEPENDENT_AMBULATORY_CARE_PROVIDER_SITE_OTHER): Payer: PPO | Admitting: Family Medicine

## 2022-10-15 ENCOUNTER — Encounter: Payer: Self-pay | Admitting: Family Medicine

## 2022-10-15 VITALS — BP 131/71 | HR 78 | Temp 98.0°F | Resp 14 | Ht 63.0 in | Wt 190.0 lb

## 2022-10-15 DIAGNOSIS — K219 Gastro-esophageal reflux disease without esophagitis: Secondary | ICD-10-CM

## 2022-10-15 DIAGNOSIS — J449 Chronic obstructive pulmonary disease, unspecified: Secondary | ICD-10-CM

## 2022-10-15 DIAGNOSIS — E559 Vitamin D deficiency, unspecified: Secondary | ICD-10-CM | POA: Diagnosis not present

## 2022-10-15 DIAGNOSIS — Z87891 Personal history of nicotine dependence: Secondary | ICD-10-CM

## 2022-10-15 DIAGNOSIS — Z0000 Encounter for general adult medical examination without abnormal findings: Secondary | ICD-10-CM | POA: Diagnosis not present

## 2022-10-15 DIAGNOSIS — Z23 Encounter for immunization: Secondary | ICD-10-CM | POA: Diagnosis not present

## 2022-10-15 DIAGNOSIS — I700 Atherosclerosis of aorta: Secondary | ICD-10-CM

## 2022-10-15 NOTE — Progress Notes (Signed)
Annual Wellness Visit     Patient: Theresa White, Female    DOB: [birth year], 66 y.o.   MRN: [id number] Visit Date: 10/15/2022  Today's Provider: Lelon Huh, MD   Chief Complaint  Patient presents with   Medicare Wellness   Gastroesophageal Reflux   Subjective    Theresa White is a 66 y.o. female who presents today for her Annual Wellness Visit.   Medications: Outpatient Medications Prior to Visit  Medication Sig   ascorbic acid (VITAMIN C) 1000 MG tablet Take 1,000 mg by mouth daily.   aspirin 81 MG tablet Take 81 mg by mouth daily.   CALCIUM PO Take by mouth daily.   cetirizine (ZYRTEC) 10 MG tablet Zyrtec 10 mg tablet   1 tablet every other day by oral route.   Cholecalciferol (VITAMIN D) 125 MCG (5000 UT) CAPS Take 1 capsule by mouth daily.   Cyanocobalamin (VITAMIN B-12 PO) Take by mouth daily.   dexlansoprazole (DEXILANT) 60 MG capsule Take 1 capsule (60 mg total) by mouth daily.   loratadine (CLARITIN) 10 MG tablet Take 10 mg by mouth daily.   montelukast (SINGULAIR) 10 MG tablet Take 1 tablet (10 mg total) by mouth daily.   Potassium 99 MG TABS Take 99 mg by mouth.   [DISCONTINUED] VITAMIN D, CHOLECALCIFEROL, PO Take 1 tablet by mouth daily.   [DISCONTINUED] benzonatate (TESSALON) 200 MG capsule Take 1 capsule (200 mg total) by mouth 2 (two) times daily as needed for cough. (Patient not taking: Reported on 10/15/2022)   [DISCONTINUED] fluticasone (FLONASE) 50 MCG/ACT nasal spray Place 2 sprays into both nostrils daily for 10 days.   [DISCONTINUED] Omega-3 Fatty Acids (FISH OIL PO) Take by mouth daily. (Patient not taking: Reported on 10/15/2022)   No facility-administered medications prior to visit.    Allergies  Allergen Reactions   Codeine Itching    Patient Care Team: Birdie Sons, MD as PCP - General (Family Medicine) Christene Lye, MD (General Surgery) Carloyn Manner, MD as Referring Physician (Otolaryngology) Earnestine Leys, MD  (Orthopedic Surgery) Lin Landsman, MD as Consulting Physician (Gastroenterology)        Objective    Most recent functional status assessment:    10/15/2022    9:12 AM  In your present state of health, do you have any difficulty performing the following activities:  Hearing? 0  Vision? 0  Difficulty concentrating or making decisions? 0  Walking or climbing stairs? 0  Dressing or bathing? 0  Doing errands, shopping? 0   Most recent fall risk assessment:    10/15/2022    9:11 AM  Fall Risk   Falls in the past year? 0  Number falls in past yr: 0  Injury with Fall? 0  Risk for fall due to : No Fall Risks    Most recent depression screenings:    10/15/2022    9:10 AM 10/10/2021    9:09 AM  PHQ 2/9 Scores  PHQ - 2 Score 0 0  PHQ- 9 Score 0 2   Most recent cognitive screening:    10/10/2021    9:06 AM  6CIT Screen  What Year? 0 points  What month? 0 points  What time? 0 points  Count back from 20 0 points  Months in reverse 0 points  Repeat phrase 0 points  Total Score 0 points   Most recent Audit-C alcohol use screening    10/15/2022    9:11 AM  Alcohol Use Disorder  Test (AUDIT)  1. How often do you have a drink containing alcohol? 0  2. How many drinks containing alcohol do you have on a typical day when you are drinking? 0  3. How often do you have six or more drinks on one occasion? 0  AUDIT-C Score 0   A score of 3 or more in women, and 4 or more in men indicates increased risk for alcohol abuse, EXCEPT if all of the points are from question 1   Results for orders placed or performed in visit on 10/15/22  HM DEXA SCAN  Result Value Ref Range   HM Dexa Scan Normal hip, osteopenia LS spine     Assessment & Plan     Annual wellness visit done today including the all of the following: Reviewed patient's Family Medical History Reviewed and updated list of patient's medical providers Assessment of cognitive impairment was done Assessed  patient's functional ability Established a written schedule for health screening [facility] Completed and Reviewed  Exercise Activities and Dietary recommendations  Goals   None      There is no immunization history on file for this patient.  Health Maintenance  Topic Date Due   COVID-19 Vaccine (1) Never done   Pneumonia Vaccine 46+ Years old (1 - PCV) Never done   TETANUS/TDAP  Never done   Zoster Vaccines- Shingrix (1 of 2) Never done   Medicare Annual Wellness (AWV)  10/10/2022   Lung Cancer Screening  11/09/2022   INFLUENZA VACCINE  03/02/2023 (Originally 07/02/2022)   MAMMOGRAM  05/23/2023   COLONOSCOPY (Pts 45-2yr Insurance coverage will need to be confirmed)  12/19/2026   DEXA SCAN  Completed   Hepatitis C Screening  Completed   HPV VACCINES  Aged Out     Discussed health benefits of physical activity, and encouraged her to engage in regular exercise appropriate for her age and condition.         The entirety of the information documented in the History of Present Illness, Review of Systems and Physical Exam were personally obtained by me. Portions of this information were initially documented by the CMA and reviewed by me for thoroughness and accuracy.     DLelon Huh MD  [phone](phone) [phone](fax)  [place]

## 2022-10-15 NOTE — Patient Instructions (Signed)
Please review the attached list of medications and notify my office if there are any errors.   I recommend that you get the Prevnar 20 vaccine to protect yourself from certain dangerous strains of pneumonia. You can get Prevnar 20 at your pharmacy, or call our office at [phone] at your earliest convenience to schedule this vaccine.   The CDC recommends two doses of Shingrix (the shingles vaccine) separated by 2 to 6 months for adults age 66 years and older. I recommend checking with your insurance plan regarding coverage for this vaccine.

## 2022-10-16 LAB — CBC
Hematocrit: 39.8 % (ref 34.0–46.6)
Hemoglobin: 13.7 g/dL (ref 11.1–15.9)
MCH: 32.9 pg (ref 26.6–33.0)
MCHC: 34.4 g/dL (ref 31.5–35.7)
MCV: 96 fL (ref 79–97)
Platelets: 288 10*3/uL (ref 150–450)
RBC: 4.16 x10E6/uL (ref 3.77–5.28)
RDW: 12.6 % (ref 11.7–15.4)
WBC: 6.5 10*3/uL (ref 3.4–10.8)

## 2022-10-16 LAB — COMPREHENSIVE METABOLIC PANEL
ALT: 9 IU/L (ref 0–32)
AST: 24 IU/L (ref 0–40)
Albumin/Globulin Ratio: 1.8 (ref 1.2–2.2)
Albumin: 4.1 g/dL (ref 3.9–4.9)
Alkaline Phosphatase: 133 IU/L — ABNORMAL HIGH (ref 44–121)
BUN/Creatinine Ratio: 15 (ref 12–28)
BUN: 14 mg/dL (ref 8–27)
Bilirubin Total: 0.3 mg/dL (ref 0.0–1.2)
CO2: 22 mmol/L (ref 20–29)
Calcium: 9.5 mg/dL (ref 8.7–10.3)
Chloride: 105 mmol/L (ref 96–106)
Creatinine, Ser: 0.94 mg/dL (ref 0.57–1.00)
Globulin, Total: 2.3 g/dL (ref 1.5–4.5)
Glucose: 93 mg/dL (ref 70–99)
Potassium: 5.3 mmol/L — ABNORMAL HIGH (ref 3.5–5.2)
Sodium: 144 mmol/L (ref 134–144)
Total Protein: 6.4 g/dL (ref 6.0–8.5)
eGFR: 67 mL/min/{1.73_m2} (ref >59)

## 2022-10-16 LAB — LIPID PANEL
Chol/HDL Ratio: 2.8 ratio (ref 0.0–4.4)
Cholesterol, Total: 134 mg/dL (ref 100–199)
HDL: 48 mg/dL (ref >39)
LDL Chol Calc (NIH): 60 mg/dL (ref 0–99)
Triglycerides: 149 mg/dL (ref 0–149)
VLDL Cholesterol Cal: 26 mg/dL (ref 5–40)

## 2022-10-16 LAB — VITAMIN D 25 HYDROXY (VIT D DEFICIENCY, FRACTURES): Vit D, 25-Hydroxy: 41.2 ng/mL (ref 30.0–100.0)

## 2022-11-11 ENCOUNTER — Ambulatory Visit
Admission: RE | Admit: 2022-11-11 | Discharge: 2022-11-11 | Disposition: A | Discharge status: Home / Self Care | Payer: PPO | Source: Ambulatory Visit | Attending: Acute Care | Admitting: Acute Care

## 2022-11-11 DIAGNOSIS — F17210 Nicotine dependence, cigarettes, uncomplicated: Secondary | ICD-10-CM | POA: Insufficient documentation

## 2022-11-11 DIAGNOSIS — Z87891 Personal history of nicotine dependence: Secondary | ICD-10-CM | POA: Diagnosis not present

## 2022-11-11 DIAGNOSIS — I2510 Atherosclerotic heart disease of native coronary artery without angina pectoris: Secondary | ICD-10-CM | POA: Diagnosis not present

## 2022-11-11 DIAGNOSIS — I700 Atherosclerosis of aorta: Secondary | ICD-10-CM | POA: Insufficient documentation

## 2022-11-11 DIAGNOSIS — J439 Emphysema, unspecified: Secondary | ICD-10-CM | POA: Diagnosis not present

## 2022-11-11 DIAGNOSIS — Z122 Encounter for screening for malignant neoplasm of respiratory organs: Secondary | ICD-10-CM | POA: Diagnosis not present

## 2022-11-13 ENCOUNTER — Other Ambulatory Visit: Payer: Self-pay

## 2022-11-13 ENCOUNTER — Other Ambulatory Visit (HOSPITAL_COMMUNITY): Payer: Self-pay

## 2022-11-13 DIAGNOSIS — Z122 Encounter for screening for malignant neoplasm of respiratory organs: Secondary | ICD-10-CM

## 2022-11-13 DIAGNOSIS — Z87891 Personal history of nicotine dependence: Secondary | ICD-10-CM

## 2022-11-13 DIAGNOSIS — F17210 Nicotine dependence, cigarettes, uncomplicated: Secondary | ICD-10-CM

## 2022-12-26 ENCOUNTER — Ambulatory Visit (INDEPENDENT_AMBULATORY_CARE_PROVIDER_SITE_OTHER): Payer: PPO | Admitting: Physician Assistant

## 2022-12-26 ENCOUNTER — Encounter: Payer: Self-pay | Admitting: Physician Assistant

## 2022-12-26 ENCOUNTER — Ambulatory Visit: Payer: Self-pay | Admitting: *Deleted

## 2022-12-26 VITALS — BP 134/74 | HR 99 | Ht 63.5 in | Wt 188.1 lb

## 2022-12-26 DIAGNOSIS — R350 Frequency of micturition: Secondary | ICD-10-CM

## 2022-12-26 DIAGNOSIS — R1084 Generalized abdominal pain: Secondary | ICD-10-CM

## 2022-12-26 LAB — POCT URINALYSIS DIPSTICK
Bilirubin, UA: NEGATIVE
Blood, UA: NEGATIVE
Glucose, UA: NEGATIVE
Ketones, UA: NEGATIVE
Nitrite, UA: NEGATIVE
Protein, UA: NEGATIVE
Spec Grav, UA: 1.01 (ref 1.010–1.025)
Urobilinogen, UA: 0.2 E.U./dL
pH, UA: 6 (ref 5.0–8.0)

## 2022-12-26 MED ORDER — CIPROFLOXACIN HCL 500 MG PO TABS: 500.0000 mg | ORAL_TABLET | Freq: Two times a day (BID) | ORAL | 0 refills | Status: AC

## 2022-12-26 NOTE — Telephone Encounter (Signed)
  Chief Complaint: Abd pain all over   Has diverticulitis Symptoms: Pain in upper and lower abd wide spread, Diarrhea once yesterday Frequency: Started yesterday with stomach not feeling good but pain started during the night last night Pertinent Negatives: Patient denies vomiting Disposition: '[]'$ ED /'[]'$ Urgent Care (no appt availability in office) / '[x]'$ Appointment(In office/virtual)/ '[]'$  [facility]/ '[]'$ Home Care/ '[]'$ Refused Recommended Disposition /'[]'$ [facility]/ '[]'$  Follow-up with PCP Additional Notes: Appt made for today with Mikey Kirschner, PA-C for 1:20.

## 2022-12-26 NOTE — Progress Notes (Signed)
I,Sha'taria Tyson,acting as a Education administrator for [employer], PA-C.,have documented all relevant documentation on the behalf of Mikey Kirschner, PA-C,as directed by  Mikey Kirschner, PA-C while in the presence of Mikey Kirschner, PA-C.   Established patient visit   Patient: Theresa White   DOB: Jan 17, 1956   67 y.o. Female  MRN: [id number] Visit Date: 12/26/2022  Today's healthcare provider: Mikey Kirschner, PA-C   Cc. Abdominal pain x 1 day  Subjective     Pt has a pmh of diverticulitis, reports lower abdominal pain that radiates to the RUQ x 1 day, reports usually 2-3 BM daily, yesterday an episode of diarrhea and no BM since. Reports some associated nausea. Reports associated urinary frequency, denies hematuria, dysuria. Denies blood in stool.    Medications: Outpatient Medications Prior to Visit  Medication Sig   ascorbic acid (VITAMIN C) 1000 MG tablet Take 1,000 mg by mouth daily.   aspirin 81 MG tablet Take 81 mg by mouth daily.   CALCIUM PO Take by mouth daily.   cetirizine (ZYRTEC) 10 MG tablet Zyrtec 10 mg tablet   1 tablet every other day by oral route.   Cholecalciferol (VITAMIN D) 125 MCG (5000 UT) CAPS Take 1 capsule by mouth daily.   Cyanocobalamin (VITAMIN B-12 PO) Take by mouth daily.   dexlansoprazole (DEXILANT) 60 MG capsule Take 1 capsule (60 mg total) by mouth daily.   loratadine (CLARITIN) 10 MG tablet Take 10 mg by mouth daily.   montelukast (SINGULAIR) 10 MG tablet Take 1 tablet (10 mg total) by mouth daily.   Potassium 99 MG TABS Take 99 mg by mouth.   No facility-administered medications prior to visit.    Review of Systems  Gastrointestinal:  Positive for abdominal pain.     Objective    Blood pressure 134/74, pulse 99, height 5' 3.5" (1.613 m), weight 188 lb 1.6 oz (85.3 kg), SpO2 99 %.   Physical Exam Vitals reviewed.  Constitutional:      Appearance: She is not ill-appearing.  HENT:     Head: Normocephalic.  Eyes:     Conjunctiva/sclera:  Conjunctivae normal.  Cardiovascular:     Rate and Rhythm: Normal rate.  Pulmonary:     Effort: Pulmonary effort is normal. No respiratory distress.  Abdominal:     General: Abdomen is flat. There is no distension.     Palpations: Abdomen is soft.     Tenderness: There is generalized abdominal tenderness. There is no guarding or rebound.  Neurological:     General: No focal deficit present.     Mental Status: She is alert and oriented to person, place, and time.  Psychiatric:        Mood and Affect: Mood normal.        Behavior: Behavior normal.      Results for orders placed or performed in visit on 12/26/22  POCT Urinalysis Dipstick  Result Value Ref Range   Color, UA     Clarity, UA     Glucose, UA Negative Negative   Bilirubin, UA negative    Ketones, UA negative    Spec Grav, UA 1.010 1.010 - 1.025   Blood, UA negative    pH, UA 6.0 5.0 - 8.0   Protein, UA Negative Negative   Urobilinogen, UA 0.2 0.2 or 1.0 E.U./dL   Nitrite, UA negative    Leukocytes, UA Moderate (2+) (A) Negative   Appearance     Odor      Assessment &  Plan     Abdominal pain 2. Urinary frequency UA + leuk, sending for culture Given more generalized nature of pain -- not convinced of diverticulitis.  Ordered cbc/cmp  Will rx cipro 500 mg BID x 7 days -- to treat potential cystitis and partially cover diverticulitis. If CBC w/ leukocytosis and urine culture negative will add flagyl as well. Encouraged pt to increase fluids and focus on a bland and mostly liquid diet.   Return if symptoms worsen or fail to improve.      I, Mikey Kirschner, PA-C have reviewed all documentation for this visit. The documentation on  12/26/22  for the exam, diagnosis, procedures, and orders are all accurate and complete.  Mikey Kirschner, PA-C [facility] [address] [place], [place], [address] Office: [phone] Fax: [place]

## 2022-12-26 NOTE — Telephone Encounter (Signed)
Reason for Disposition  Age > 60 years  Answer Assessment - Initial Assessment Questions 1. LOCATION: "Where does it hurt?"      I'm having a lot of pain in my abd.   I have diverticulitis.     The pain is wide spread all over. 2. RADIATION: "Does the pain shoot anywhere else?" (e.g., chest, back)     No 3. ONSET: "When did the pain begin?" (e.g., minutes, hours or days ago)      Started during the night.   All yesterday my stomach didn't feel right. 4. SUDDEN: "Gradual or sudden onset?"     Not asked 5. PATTERN "Does the pain come and go, or is it constant?"    - If it comes and goes: "How long does it last?" "Do you have pain now?"     (Note: Comes and goes means the pain is intermittent. It goes away completely between bouts.)    - If constant: "Is it getting better, staying the same, or getting worse?"      (Note: Constant means the pain never goes away completely; most serious pain is constant and gets worse.)      Not diarrhea or vomiting.     I had diarrhea yesterday at 2:00. 6. SEVERITY: "How bad is the pain?"  (e.g., Scale 1-10; mild, moderate, or severe)    - MILD (1-3): Doesn't interfere with normal activities, abdomen soft and not tender to touch.     - MODERATE (4-7): Interferes with normal activities or awakens from sleep, abdomen tender to touch.     - SEVERE (8-10): Excruciating pain, doubled over, unable to do any normal activities.       Moderate pain.    I usually need antibiotics 7. RECURRENT SYMPTOM: "Have you ever had this type of stomach pain before?" If Yes, ask: "When was the last time?" and "What happened that time?"      Yes 8. CAUSE: "What do you think is causing the stomach pain?"     Diverticulitis.    9. RELIEVING/AGGRAVATING FACTORS: "What makes it better or worse?" (e.g., antacids, bending or twisting motion, bowel movement)     Nothing 10. OTHER SYMPTOMS: "Do you have any other symptoms?" (e.g., back pain, diarrhea, fever, urination pain, vomiting)        Diarrhea once yesterday and my stomach didn't feel good all day. 11. PREGNANCY: "Is there any chance you are pregnant?" "When was your last menstrual period?"       N/A due to age  Protocols used: Abdominal Pain - [facility]

## 2022-12-27 ENCOUNTER — Other Ambulatory Visit: Payer: Self-pay | Admitting: Physician Assistant

## 2022-12-27 LAB — COMPREHENSIVE METABOLIC PANEL
ALT: 6 IU/L (ref 0–32)
AST: 17 IU/L (ref 0–40)
Albumin/Globulin Ratio: 1.8 (ref 1.2–2.2)
Albumin: 4.2 g/dL (ref 3.9–4.9)
Alkaline Phosphatase: 153 IU/L — ABNORMAL HIGH (ref 44–121)
BUN/Creatinine Ratio: 11 — ABNORMAL LOW (ref 12–28)
BUN: 10 mg/dL (ref 8–27)
Bilirubin Total: 0.4 mg/dL (ref 0.0–1.2)
CO2: 22 mmol/L (ref 20–29)
Calcium: 9.3 mg/dL (ref 8.7–10.3)
Chloride: 103 mmol/L (ref 96–106)
Creatinine, Ser: 0.88 mg/dL (ref 0.57–1.00)
Globulin, Total: 2.3 g/dL (ref 1.5–4.5)
Glucose: 135 mg/dL — ABNORMAL HIGH (ref 70–99)
Potassium: 4.1 mmol/L (ref 3.5–5.2)
Sodium: 140 mmol/L (ref 134–144)
Total Protein: 6.5 g/dL (ref 6.0–8.5)
eGFR: 72 mL/min/{1.73_m2} (ref >59)

## 2022-12-27 LAB — CBC WITH DIFFERENTIAL/PLATELET
Basophils Absolute: 0 10*3/uL (ref 0.0–0.2)
Basos: 0 %
EOS (ABSOLUTE): 0.2 10*3/uL (ref 0.0–0.4)
Eos: 1 %
Hematocrit: 40.8 % (ref 34.0–46.6)
Hemoglobin: 13.9 g/dL (ref 11.1–15.9)
Immature Grans (Abs): 0 10*3/uL (ref 0.0–0.1)
Immature Granulocytes: 0 %
Lymphocytes Absolute: 1.5 10*3/uL (ref 0.7–3.1)
Lymphs: 10 %
MCH: 32 pg (ref 26.6–33.0)
MCHC: 34.1 g/dL (ref 31.5–35.7)
MCV: 94 fL (ref 79–97)
Monocytes Absolute: 1.1 10*3/uL — ABNORMAL HIGH (ref 0.1–0.9)
Monocytes: 8 %
Neutrophils Absolute: 11.6 10*3/uL — ABNORMAL HIGH (ref 1.4–7.0)
Neutrophils: 81 %
Platelets: 322 10*3/uL (ref 150–450)
RBC: 4.34 x10E6/uL (ref 3.77–5.28)
RDW: 12.9 % (ref 11.7–15.4)
WBC: 14.4 10*3/uL — ABNORMAL HIGH (ref 3.4–10.8)

## 2022-12-27 MED ORDER — METRONIDAZOLE 500 MG PO TABS: 500.0000 mg | ORAL_TABLET | Freq: Three times a day (TID) | ORAL | 0 refills | Status: AC

## 2022-12-28 LAB — URINE CULTURE

## 2023-01-14 ENCOUNTER — Other Ambulatory Visit (HOSPITAL_COMMUNITY): Payer: Self-pay

## 2023-01-17 ENCOUNTER — Encounter: Payer: Self-pay | Admitting: Family Medicine

## 2023-01-17 ENCOUNTER — Other Ambulatory Visit (HOSPITAL_COMMUNITY): Payer: Self-pay

## 2023-01-20 ENCOUNTER — Other Ambulatory Visit (HOSPITAL_COMMUNITY): Payer: Self-pay

## 2023-01-20 ENCOUNTER — Other Ambulatory Visit: Payer: Self-pay

## 2023-01-20 DIAGNOSIS — J449 Chronic obstructive pulmonary disease, unspecified: Secondary | ICD-10-CM

## 2023-01-20 DIAGNOSIS — K219 Gastro-esophageal reflux disease without esophagitis: Secondary | ICD-10-CM

## 2023-01-20 MED ORDER — DEXLANSOPRAZOLE 60 MG PO CPDR
60.0000 mg | DELAYED_RELEASE_CAPSULE | Freq: Every day | ORAL | 4 refills | Status: DC
  Filled 2023-01-20 – 2023-03-02 (×3): qty 90, 90d supply, fill #0
  Filled 2023-06-30: qty 90, 90d supply, fill #1
  Filled 2023-09-24: qty 90, 90d supply, fill #2

## 2023-01-20 MED ORDER — MONTELUKAST SODIUM 10 MG PO TABS
10.0000 mg | ORAL_TABLET | Freq: Every day | ORAL | 3 refills | Status: DC
  Filled 2023-01-20: qty 90, 90d supply, fill #0
  Filled 2023-04-20: qty 90, 90d supply, fill #1
  Filled 2023-07-15: qty 90, 90d supply, fill #2
  Filled 2023-10-13: qty 90, 90d supply, fill #3

## 2023-01-21 ENCOUNTER — Encounter (HOSPITAL_COMMUNITY): Payer: Self-pay

## 2023-01-21 ENCOUNTER — Other Ambulatory Visit (HOSPITAL_COMMUNITY): Payer: Self-pay

## 2023-01-24 ENCOUNTER — Other Ambulatory Visit: Payer: Self-pay

## 2023-02-21 DIAGNOSIS — L821 Other seborrheic keratosis: Secondary | ICD-10-CM | POA: Diagnosis not present

## 2023-02-21 DIAGNOSIS — L570 Actinic keratosis: Secondary | ICD-10-CM | POA: Diagnosis not present

## 2023-02-21 DIAGNOSIS — L298 Other pruritus: Secondary | ICD-10-CM | POA: Diagnosis not present

## 2023-03-03 ENCOUNTER — Other Ambulatory Visit: Payer: Self-pay

## 2023-03-03 ENCOUNTER — Other Ambulatory Visit (HOSPITAL_COMMUNITY): Payer: Self-pay

## 2023-03-03 ENCOUNTER — Encounter: Payer: Self-pay | Admitting: Pharmacist

## 2023-03-04 ENCOUNTER — Other Ambulatory Visit: Payer: Self-pay

## 2023-03-31 DIAGNOSIS — G4733 Obstructive sleep apnea (adult) (pediatric): Secondary | ICD-10-CM | POA: Diagnosis not present

## 2023-04-21 ENCOUNTER — Other Ambulatory Visit: Payer: Self-pay

## 2023-05-26 DIAGNOSIS — Z1231 Encounter for screening mammogram for malignant neoplasm of breast: Secondary | ICD-10-CM | POA: Diagnosis not present

## 2023-05-26 LAB — HM MAMMOGRAPHY

## 2023-06-30 ENCOUNTER — Other Ambulatory Visit (HOSPITAL_COMMUNITY): Payer: Self-pay

## 2023-07-01 ENCOUNTER — Ambulatory Visit (INDEPENDENT_AMBULATORY_CARE_PROVIDER_SITE_OTHER): Payer: PPO

## 2023-07-01 ENCOUNTER — Other Ambulatory Visit: Payer: Self-pay

## 2023-07-01 VITALS — Ht 63.5 in | Wt 183.0 lb

## 2023-07-01 DIAGNOSIS — Z0000 Encounter for general adult medical examination without abnormal findings: Secondary | ICD-10-CM | POA: Diagnosis not present

## 2023-07-01 NOTE — Patient Instructions (Signed)
Theresa White , Thank you for taking time to come for your Medicare Wellness Visit. I appreciate your ongoing commitment to your health goals. Please review the following plan we discussed and let me know if I can assist you in the future.   Referrals/Orders/Follow-Ups/Clinician Recommendations: none  This is a list of the screening recommended for you and due dates:  Health Maintenance  Topic Date Due   Pneumonia Vaccine (1 of 2 - PCV) Never done   DTaP/Tdap/Td vaccine (1 - Tdap) Never done   Zoster (Shingles) Vaccine (1 of 2) Never done   COVID-19 Vaccine (1 - 2023-24 season) Never done   Flu Shot  07/03/2023   Screening for Lung Cancer  11/12/2023   Mammogram  05/25/2024   Medicare Annual Wellness Visit  06/30/2024   Colon Cancer Screening  12/19/2026   DEXA scan (bone density measurement)  Completed   Hepatitis C Screening  Completed   HPV Vaccine  Aged Out    Advanced directives: (Declined) Advance directive discussed with you today. Even though you declined this today, please call our office should you change your mind, and we can give you the proper paperwork for you to fill out.  Next Medicare Annual Wellness Visit scheduled for next year: Yes08/04/2024 @ 2pm telephone  Preventive Care 67 Years and Older, Female Preventive care refers to lifestyle choices and visits with your health care provider that can promote health and wellness. What does preventive care include? A yearly physical exam. This is also called an annual well check. Dental exams once or twice a year. Routine eye exams. Ask your health care provider how often you should have your eyes checked. Personal lifestyle choices, including: Daily care of your teeth and gums. Regular physical activity. Eating a healthy diet. Avoiding tobacco and drug use. Limiting alcohol use. Practicing safe sex. Taking low-dose aspirin every day. Taking vitamin and mineral supplements as recommended by your health care  provider. What happens during an annual well check? The services and screenings done by your health care provider during your annual well check will depend on your age, overall health, lifestyle risk factors, and family history of disease. Counseling  Your health care provider may ask you questions about your: Alcohol use. Tobacco use. Drug use. Emotional well-being. Home and relationship well-being. Sexual activity. Eating habits. History of falls. Memory and ability to understand (cognition). Work and work Astronomer. Reproductive health. Screening  You may have the following tests or measurements: Height, weight, and BMI. Blood pressure. Lipid and cholesterol levels. These may be checked every 5 years, or more frequently if you are over 54 years old. Skin check. Lung cancer screening. You may have this screening every year starting at age 65 if you have a 30-pack-year history of smoking and currently smoke or have quit within the past 15 years. Fecal occult blood test (FOBT) of the stool. You may have this test every year starting at age 35. Flexible sigmoidoscopy or colonoscopy. You may have a sigmoidoscopy every 5 years or a colonoscopy every 10 years starting at age 35. Hepatitis C blood test. Hepatitis B blood test. Sexually transmitted disease (STD) testing. Diabetes screening. This is done by checking your blood sugar (glucose) after you have not eaten for a while (fasting). You may have this done every 1-3 years. Bone density scan. This is done to screen for osteoporosis. You may have this done starting at age 35. Mammogram. This may be done every 1-2 years. Talk to your health care  provider about how often you should have regular mammograms. Talk with your health care provider about your test results, treatment options, and if necessary, the need for more tests. Vaccines  Your health care provider may recommend certain vaccines, such as: Influenza vaccine. This is  recommended every year. Tetanus, diphtheria, and acellular pertussis (Tdap, Td) vaccine. You may need a Td booster every 10 years. Zoster vaccine. You may need this after age 67. Pneumococcal 13-valent conjugate (PCV13) vaccine. One dose is recommended after age 17. Pneumococcal polysaccharide (PPSV23) vaccine. One dose is recommended after age 67. Talk to your health care provider about which screenings and vaccines you need and how often you need them. This information is not intended to replace advice given to you by your health care provider. Make sure you discuss any questions you have with your health care provider. Document Released: 12/15/2015 Document Revised: 08/07/2016 Document Reviewed: 09/19/2015 Elsevier Interactive Patient Education  2017 [employer].  Fall Prevention in the Home Falls can cause injuries. They can happen to people of all ages. There are many things you can do to make your home safe and to help prevent falls. What can I do on the outside of my home? Regularly fix the edges of walkways and driveways and fix any cracks. Remove anything that might make you trip as you walk through a door, such as a raised step or threshold. Trim any bushes or trees on the path to your home. Use bright outdoor lighting. Clear any walking paths of anything that might make someone trip, such as rocks or tools. Regularly check to see if handrails are loose or broken. Make sure that both sides of any steps have handrails. Any raised decks and porches should have guardrails on the edges. Have any leaves, snow, or ice cleared regularly. Use sand or salt on walking paths during winter. Clean up any spills in your garage right away. This includes oil or grease spills. What can I do in the bathroom? Use night lights. Install grab bars by the toilet and in the tub and shower. Do not use towel bars as grab bars. Use non-skid mats or decals in the tub or shower. If you need to sit down in  the shower, use a plastic, non-slip stool. Keep the floor dry. Clean up any water that spills on the floor as soon as it happens. Remove soap buildup in the tub or shower regularly. Attach bath mats securely with double-sided non-slip rug tape. Do not have throw rugs and other things on the floor that can make you trip. What can I do in the bedroom? Use night lights. Make sure that you have a light by your bed that is easy to reach. Do not use any sheets or blankets that are too big for your bed. They should not hang down onto the floor. Have a firm chair that has side arms. You can use this for support while you get dressed. Do not have throw rugs and other things on the floor that can make you trip. What can I do in the kitchen? Clean up any spills right away. Avoid walking on wet floors. Keep items that you use a lot in easy-to-reach places. If you need to reach something above you, use a strong step stool that has a grab bar. Keep electrical cords out of the way. Do not use floor polish or wax that makes floors slippery. If you must use wax, use non-skid floor wax. Do not have throw rugs  and other things on the floor that can make you trip. What can I do with my stairs? Do not leave any items on the stairs. Make sure that there are handrails on both sides of the stairs and use them. Fix handrails that are broken or loose. Make sure that handrails are as long as the stairways. Check any carpeting to make sure that it is firmly attached to the stairs. Fix any carpet that is loose or worn. Avoid having throw rugs at the top or bottom of the stairs. If you do have throw rugs, attach them to the floor with carpet tape. Make sure that you have a light switch at the top of the stairs and the bottom of the stairs. If you do not have them, ask someone to add them for you. What else can I do to help prevent falls? Wear shoes that: Do not have high heels. Have rubber bottoms. Are comfortable  and fit you well. Are closed at the toe. Do not wear sandals. If you use a stepladder: Make sure that it is fully opened. Do not climb a closed stepladder. Make sure that both sides of the stepladder are locked into place. Ask someone to hold it for you, if possible. Clearly mark and make sure that you can see: Any grab bars or handrails. First and last steps. Where the edge of each step is. Use tools that help you move around (mobility aids) if they are needed. These include: Canes. Walkers. Scooters. Crutches. Turn on the lights when you go into a dark area. Replace any light bulbs as soon as they burn out. Set up your furniture so you have a clear path. Avoid moving your furniture around. If any of your floors are uneven, fix them. If there are any pets around you, be aware of where they are. Review your medicines with your doctor. Some medicines can make you feel dizzy. This can increase your chance of falling. Ask your doctor what other things that you can do to help prevent falls. This information is not intended to replace advice given to you by your health care provider. Make sure you discuss any questions you have with your health care provider. Document Released: 09/14/2009 Document Revised: 04/25/2016 Document Reviewed: 12/23/2014 Elsevier Interactive Patient Education  2017 [employer].

## 2023-07-01 NOTE — Progress Notes (Signed)
Subjective:   Theresa White is a 67 y.o. female who presents for Medicare Annual (Subsequent) preventive examination.  Visit Complete: Virtual  I connected with  DAPHNA FILAR on 07/01/23 by a video and audio enabled telemedicine application and verified that I am speaking with the correct person using two identifiers.  Patient Location: Home  Provider Location: Office/Clinic  I discussed the limitations of evaluation and management by telemedicine. The patient expressed understanding and agreed to proceed.  Patient Medicare AWV questionnaire was completed by the patient on (not done); I have confirmed that all information answered by patient is correct and no changes since this date.  Review of Systems    Cardiac Risk Factors include: advanced age (>73men, >52 women);obesity (BMI >30kg/m2);sedentary lifestyle    Objective:    Today's Vitals   07/01/23 1414  Weight: 183 lb (83 kg)  Height: 5' 3.5" (1.613 m)   Body mass index is 31.91 kg/m.     07/01/2023    2:24 PM  Advanced Directives  Does Patient Have a Medical Advance Directive? No    Current Medications (verified) Outpatient Encounter Medications as of 07/01/2023  Medication Sig   ascorbic acid (VITAMIN C) 1000 MG tablet Take 1,000 mg by mouth daily.   aspirin 81 MG tablet Take 81 mg by mouth daily.   CALCIUM PO Take by mouth daily.   cetirizine (ZYRTEC) 10 MG tablet Zyrtec 10 mg tablet   1 tablet every other day by oral route.   Cholecalciferol (VITAMIN D) 125 MCG (5000 UT) CAPS Take 1 capsule by mouth daily.   Cyanocobalamin (VITAMIN B-12 PO) Take by mouth daily.   dexlansoprazole (DEXILANT) 60 MG capsule Take 1 capsule (60 mg total) by mouth daily.   loratadine (CLARITIN) 10 MG tablet Take 10 mg by mouth daily.   montelukast (SINGULAIR) 10 MG tablet Take 1 tablet (10 mg total) by mouth daily.   Potassium 99 MG TABS Take 99 mg by mouth.   No facility-administered encounter medications on file as of 07/01/2023.     Allergies (verified) Codeine   History: Past Medical History:  Diagnosis Date   Bowel trouble 1995   Diffuse cystic mastopathy 2012   Tension headache    Past Surgical History:  Procedure Laterality Date   ABDOMINAL EXPLORATION SURGERY     BLEPHAROPLASTY Bilateral 06/2021   COLONOSCOPY WITH PROPOFOL N/A 12/20/2019   Procedure: COLONOSCOPY WITH PROPOFOL;  Surgeon: Toney Reil, MD;  Location: [facility]COPY;  Service: Gastroenterology;  Laterality: N/A;   FOOT SURGERY Left 2010   TONSILLECTOMY AND ADENOIDECTOMY     TOTAL ABDOMINAL HYSTERECTOMY W/ BILATERAL SALPINGOOPHORECTOMY  1996   Laparoscopically assisted for pelvic pain   Family History  Problem Relation Age of Onset   Kidney failure Mother    Stroke Mother    Diabetes Mother    Emphysema Father    Hypertension Father    Aneurysm Sister        Cerebral   Cervical cancer Sister 54   Healthy Sister    Bladder Cancer Brother 17   Healthy Brother    Breast cancer Neg Hx    Social History   Socioeconomic History   Marital status: Married    Spouse name: Not on file   Number of children: 2   Years of education: Not on file   Highest education level: Not on file  Occupational History   Occupation: Production planning  Tobacco Use   Smoking status: Former  Current packs/day: 0.00    Average packs/day: 0.8 packs/day for 25.0 years (18.8 ttl pk-yrs)    Types: Cigarettes    Start date: 11/02/1995    Quit date: 11/01/2020    Years since quitting: 2.6   Smokeless tobacco: Never   Tobacco comments:    Started as teenager, averaging 1 ppd since  Vaping Use   Vaping status: Never Used  Substance and Sexual Activity   Alcohol use: No    Alcohol/week: 0.0 standard drinks of alcohol   Drug use: No   Sexual activity: Not on file  Other Topics Concern   Not on file  Social History Narrative   Not on file   Social Determinants of Health   Financial Resource Strain: Low Risk  (07/01/2023)   Overall  Financial Resource Strain (CARDIA)    Difficulty of Paying Living Expenses: Not hard at all  Food Insecurity: No Food Insecurity (07/01/2023)   Hunger Vital Sign    Worried About Running Out of Food in the Last Year: Never true    Ran Out of Food in the Last Year: Never true  Transportation Needs: No Transportation Needs (07/01/2023)   PRAPARE - Administrator, Civil Service (Medical): No    Lack of Transportation (Non-Medical): No  Physical Activity: Insufficiently Active (07/01/2023)   Exercise Vital Sign    Days of Exercise per Week: 3 days    Minutes of Exercise per Session: 30 min  Stress: No Stress Concern Present (07/01/2023)   [employer] of Occupational Health - Occupational Stress Questionnaire    Feeling of Stress : Not at all  Social Connections: Moderately Isolated (07/01/2023)   Social Connection and Isolation Panel [NHANES]    Frequency of Communication with Friends and Family: More than three times a week    Frequency of Social Gatherings with Friends and Family: Once a week    Attends Religious Services: Never    Database administrator or Organizations: No    Attends Engineer, structural: Never    Marital Status: Married    Tobacco Counseling Counseling given: Not Answered Tobacco comments: Started as teenager, averaging 1 ppd since   Clinical Intake:  Pre-visit preparation completed: Yes  Pain : No/denies pain     BMI - recorded: 31.91 Nutritional Status: BMI > 30  Obese Nutritional Risks: Other (Comment) Diabetes: No  How often do you need to have someone help you when you read instructions, pamphlets, or other written materials from your doctor or pharmacy?: 1 - Never  Interpreter Needed?: No  Comments: lives wih husband Information entered by :: B.Marvens Hollars,LPN   Activities of Daily Living    07/01/2023    2:24 PM 12/26/2022    1:18 PM  In your present state of health, do you have any difficulty performing the following  activities:  Hearing? 0 0  Vision? 0 0  Difficulty concentrating or making decisions? 0 0  Walking or climbing stairs? 0 0  Dressing or bathing? 0 0  Doing errands, shopping? 0 0  Preparing Food and eating ? N   Using the Toilet? N   In the past six months, have you accidently leaked urine? N   Do you have problems with loss of bowel control? N   Managing your Medications? N   Managing your Finances? N   Housekeeping or managing your Housekeeping? N     Patient Care Team: Malva Limes, MD as PCP - General (Family Medicine) Evette Cristal, [place]  Reece Agar, MD (General Surgery) Bud Face, MD as Referring Physician (Otolaryngology) Deeann Saint, MD (Orthopedic Surgery) Toney Reil, MD as Consulting Physician (Gastroenterology)  Indicate any recent Medical Services you may have received from other than [facility] providers in the past year (date may be approximate).     Assessment:   This is a routine wellness examination for Saahithi.  Hearing/Vision screen Hearing Screening - Comments:: Adequate hearing Vision Screening - Comments:: Adequate vision w/glasses Dr Larence Penning  Dietary issues and exercise activities discussed:     Goals Addressed   None    Depression Screen    07/01/2023    2:20 PM 12/26/2022    1:17 PM 10/15/2022    9:10 AM 10/10/2021    9:09 AM 09/19/2020    2:09 PM 09/13/2019    9:59 AM 09/13/2019    9:58 AM  PHQ 2/9 Scores  PHQ - 2 Score 0 0 0 0 0 0 0  PHQ- 9 Score  0 0 2 0 0     Fall Risk    07/01/2023    2:17 PM 12/26/2022    1:17 PM 10/15/2022    9:11 AM 10/10/2021    9:09 AM 09/19/2020    2:09 PM  Fall Risk   Falls in the past year? 0 0 0 0 1  Number falls in past yr: 0 0 0 0 0  Injury with Fall? 0 0 0 0 1  Risk for fall due to : No Fall Risks No Fall Risks No Fall Risks No Fall Risks   Follow up Education provided;Falls prevention discussed Falls evaluation completed  Falls evaluation completed Falls evaluation completed    MEDICARE  RISK AT HOME:  Medicare Risk at Home - 07/01/23 1417     Any stairs in or around the home? Yes    If so, are there any without handrails? Yes    Home free of loose throw rugs in walkways, pet beds, electrical cords, etc? Yes    Adequate lighting in your home to reduce risk of falls? Yes    Life alert? No    Use of a cane, walker or w/c? No    Grab bars in the bathroom? No    Shower chair or bench in shower? Yes    Elevated toilet seat or a handicapped toilet? No             TIMED UP AND GO:  Was the test performed?  No    Cognitive Function:        07/01/2023    2:26 PM 10/10/2021    9:06 AM  6CIT Screen  What Year? 0 points 0 points  What month? 0 points 0 points  What time? 0 points 0 points  Count back from 20 0 points 0 points  Months in reverse 0 points 0 points  Repeat phrase 0 points 0 points  Total Score 0 points 0 points    Immunizations  There is no immunization history on file for this patient.  TDAP status: Up to date  Flu Vaccine status: Declined, Education has been provided regarding the importance of this vaccine but patient still declined. Advised may receive this vaccine at local pharmacy or Health Dept. Aware to provide a copy of the vaccination record if obtained from local pharmacy or Health Dept. Verbalized acceptance and understanding.  Pneumococcal vaccine status: Declined,  Education has been provided regarding the importance of this vaccine but patient still declined. Advised may receive this vaccine at  local pharmacy or Health Dept. Aware to provide a copy of the vaccination record if obtained from local pharmacy or Health Dept. Verbalized acceptance and understanding.   Covid-19 vaccine status: Declined, Education has been provided regarding the importance of this vaccine but patient still declined. Advised may receive this vaccine at local pharmacy or Health Dept.or vaccine clinic. Aware to provide a copy of the vaccination record if  obtained from local pharmacy or Health Dept. Verbalized acceptance and understanding.  Qualifies for Shingles Vaccine? Yes   Zostavax completed No   Shingrix Completed?: No.    Education has been provided regarding the importance of this vaccine. Patient has been advised to call insurance company to determine out of pocket expense if they have not yet received this vaccine. Advised may also receive vaccine at local pharmacy or Health Dept. Verbalized acceptance and understanding.  Screening Tests Health Maintenance  Topic Date Due   Pneumonia Vaccine 28+ Years old (1 of 2 - PCV) Never done   DTaP/Tdap/Td (1 - Tdap) Never done   Zoster Vaccines- Shingrix (1 of 2) Never done   COVID-19 Vaccine (1 - 2023-24 season) Never done   INFLUENZA VACCINE  07/03/2023   Lung Cancer Screening  11/12/2023   MAMMOGRAM  05/25/2024   Medicare Annual Wellness (AWV)  06/30/2024   Colonoscopy  12/19/2026   DEXA SCAN  Completed   Hepatitis C Screening  Completed   HPV VACCINES  Aged Out    Health Maintenance  Health Maintenance Due  Topic Date Due   Pneumonia Vaccine 41+ Years old (1 of 2 - PCV) Never done   DTaP/Tdap/Td (1 - Tdap) Never done   Zoster Vaccines- Shingrix (1 of 2) Never done   COVID-19 Vaccine (1 - 2023-24 season) Never done    Colorectal cancer screening: Type of screening: Colonoscopy. Completed yes. Repeat every 5 years  Mammogram status: Completed yes. Repeat every year  Bone Density status: Completed yes. Results reflect: Bone density results: NORMAL. Repeat every 5 years.  Lung Cancer Screening: (Low Dose CT Chest recommended if Age 89-80 years, 20 pack-year currently smoking OR have quit w/in 15years.) does qualify.   Lung Cancer Screening Referral: no pt has orders for October  Additional Screening:  Hepatitis C Screening: does not qualify; Completed yes  Vision Screening: Recommended annual ophthalmology exams for early detection of glaucoma and other disorders of the  eye. Is the patient up to date with their annual eye exam?  Yes  Who is the provider or what is the name of the office in which the patient attends annual eye exams? Dr Larence Penning If pt is not established with a provider, would they like to be referred to a provider to establish care? No .   Dental Screening: Recommended annual dental exams for proper oral hygiene  Diabetic Foot Exam: n/a  Community Resource Referral / Chronic Care Management: CRR required this visit?  No   CCM required this visit?  No     Plan:     I have personally reviewed and noted the following in the patient's chart:   Medical and social history Use of alcohol, tobacco or illicit drugs  Current medications and supplements including opioid prescriptions. Patient is not currently taking opioid prescriptions. Functional ability and status Nutritional status Physical activity Advanced directives List of other physicians Hospitalizations, surgeries, and ER visits in previous 12 months Vitals Screenings to include cognitive, depression, and falls Referrals and appointments  In addition, I have reviewed and discussed with  patient certain preventive protocols, quality metrics, and best practice recommendations. A written personalized care plan for preventive services as well as general preventive health recommendations were provided to patient.     Sue Lush, LPN   03/26/9562   After Visit Summary: (MyChart) Due to this being a telephonic visit, the after visit summary with patients personalized plan was offered to patient via MyChart   Nurse Notes: The patient states she is doing well and has no concerns or questions at this time.

## 2023-07-15 ENCOUNTER — Other Ambulatory Visit (HOSPITAL_COMMUNITY): Payer: Self-pay

## 2023-08-27 DIAGNOSIS — M25511 Pain in right shoulder: Secondary | ICD-10-CM | POA: Diagnosis not present

## 2023-08-27 DIAGNOSIS — M47812 Spondylosis without myelopathy or radiculopathy, cervical region: Secondary | ICD-10-CM | POA: Diagnosis not present

## 2023-08-27 DIAGNOSIS — M5412 Radiculopathy, cervical region: Secondary | ICD-10-CM | POA: Diagnosis not present

## 2023-09-09 DIAGNOSIS — L570 Actinic keratosis: Secondary | ICD-10-CM | POA: Diagnosis not present

## 2023-09-09 DIAGNOSIS — L304 Erythema intertrigo: Secondary | ICD-10-CM | POA: Diagnosis not present

## 2023-09-10 DIAGNOSIS — G5601 Carpal tunnel syndrome, right upper limb: Secondary | ICD-10-CM | POA: Diagnosis not present

## 2023-09-10 DIAGNOSIS — G5621 Lesion of ulnar nerve, right upper limb: Secondary | ICD-10-CM | POA: Diagnosis not present

## 2023-09-10 DIAGNOSIS — M25511 Pain in right shoulder: Secondary | ICD-10-CM | POA: Diagnosis not present

## 2023-09-10 DIAGNOSIS — M5412 Radiculopathy, cervical region: Secondary | ICD-10-CM | POA: Diagnosis not present

## 2023-09-24 ENCOUNTER — Other Ambulatory Visit: Payer: Self-pay

## 2023-09-24 ENCOUNTER — Other Ambulatory Visit (HOSPITAL_COMMUNITY): Payer: Self-pay

## 2023-09-25 ENCOUNTER — Other Ambulatory Visit: Payer: Self-pay

## 2023-10-13 ENCOUNTER — Other Ambulatory Visit (HOSPITAL_COMMUNITY): Payer: Self-pay

## 2023-10-15 DIAGNOSIS — H903 Sensorineural hearing loss, bilateral: Secondary | ICD-10-CM | POA: Diagnosis not present

## 2023-10-16 DIAGNOSIS — G5601 Carpal tunnel syndrome, right upper limb: Secondary | ICD-10-CM | POA: Diagnosis not present

## 2023-10-17 ENCOUNTER — Encounter: Payer: Self-pay | Admitting: Family Medicine

## 2023-10-17 ENCOUNTER — Ambulatory Visit (INDEPENDENT_AMBULATORY_CARE_PROVIDER_SITE_OTHER): Payer: PPO | Admitting: Family Medicine

## 2023-10-17 ENCOUNTER — Encounter: Payer: Self-pay | Admitting: Pharmacist

## 2023-10-17 ENCOUNTER — Other Ambulatory Visit: Payer: Self-pay

## 2023-10-17 ENCOUNTER — Other Ambulatory Visit (HOSPITAL_COMMUNITY): Payer: Self-pay

## 2023-10-17 VITALS — BP 136/67 | HR 85 | Ht 63.0 in | Wt 180.0 lb

## 2023-10-17 DIAGNOSIS — K219 Gastro-esophageal reflux disease without esophagitis: Secondary | ICD-10-CM

## 2023-10-17 DIAGNOSIS — Z0000 Encounter for general adult medical examination without abnormal findings: Secondary | ICD-10-CM

## 2023-10-17 DIAGNOSIS — E559 Vitamin D deficiency, unspecified: Secondary | ICD-10-CM | POA: Diagnosis not present

## 2023-10-17 DIAGNOSIS — J449 Chronic obstructive pulmonary disease, unspecified: Secondary | ICD-10-CM

## 2023-10-17 DIAGNOSIS — R739 Hyperglycemia, unspecified: Secondary | ICD-10-CM

## 2023-10-17 MED ORDER — MONTELUKAST SODIUM 10 MG PO TABS
10.0000 mg | ORAL_TABLET | Freq: Every day | ORAL | 3 refills | Status: DC
  Filled 2023-10-17 – 2024-01-13 (×2): qty 90, 90d supply, fill #0
  Filled 2024-04-08: qty 90, 90d supply, fill #1
  Filled 2024-09-21: qty 90, 90d supply, fill #2

## 2023-10-17 MED ORDER — RABEPRAZOLE SODIUM 20 MG PO TBEC
20.0000 mg | DELAYED_RELEASE_TABLET | Freq: Every day | ORAL | 3 refills | Status: DC
  Filled 2023-10-17 – 2023-10-24 (×2): qty 90, 90d supply, fill #0
  Filled 2024-01-23: qty 90, 90d supply, fill #1
  Filled 2024-04-08 – 2024-04-19 (×2): qty 90, 90d supply, fill #2
  Filled 2024-09-21: qty 90, 90d supply, fill #3

## 2023-10-17 NOTE — Progress Notes (Unsigned)
Complete physical exam   Patient: Theresa White   DOB: 1956-04-05   67 y.o. Female  MRN: [id number] Visit Date: 10/17/2023  Today's healthcare provider: Mila Merry, MD   Chief Complaint  Patient presents with   Hypertension   Subjective    Theresa White is a 67 y.o. female who presents today for a complete physical exam.  She reports consuming a {diet types:17450} diet. {Exercise:19826} She generally feels {well/fairly well/poorly:18703}. She reports sleeping {well/fairly well/poorly:18703}. She {does/does not:200015} have additional problems to discuss today.  HPI  ***  Past Medical History:  Diagnosis Date   Bowel trouble 1995   Diffuse cystic mastopathy 2012   Tension headache    Past Surgical History:  Procedure Laterality Date   ABDOMINAL EXPLORATION SURGERY     BLEPHAROPLASTY Bilateral 06/2021   COLONOSCOPY WITH PROPOFOL N/A 12/20/2019   Procedure: COLONOSCOPY WITH PROPOFOL;  Surgeon: Toney Reil, MD;  Location: [facility] ENDOSCOPY;  Service: Gastroenterology;  Laterality: N/A;   FOOT SURGERY Left 2010   TONSILLECTOMY AND ADENOIDECTOMY     TOTAL ABDOMINAL HYSTERECTOMY W/ BILATERAL SALPINGOOPHORECTOMY  1996   Laparoscopically assisted for pelvic pain   Social History   Socioeconomic History   Marital status: Married    Spouse name: Not on file   Number of children: 2   Years of education: Not on file   Highest education level: Not on file  Occupational History   Occupation: Production planning  Tobacco Use   Smoking status: Some Days    Current packs/day: 0.00    Average packs/day: 0.8 packs/day for 25.0 years (18.8 ttl pk-yrs)    Types: Cigarettes    Start date: 11/02/1995    Last attempt to quit: 11/01/2020    Years since quitting: 2.9   Smokeless tobacco: Never   Tobacco comments:    Started as teenager, averaging 1 ppd since  Vaping Use   Vaping status: Never Used  Substance and Sexual Activity   Alcohol use: No    Alcohol/week: 0.0  standard drinks of alcohol   Drug use: No   Sexual activity: Not on file  Other Topics Concern   Not on file  Social History Narrative   Not on file   Social Determinants of Health   Financial Resource Strain: Low Risk  (07/01/2023)   Overall Financial Resource Strain (CARDIA)    Difficulty of Paying Living Expenses: Not hard at all  Food Insecurity: No Food Insecurity (07/01/2023)   Hunger Vital Sign    Worried About Running Out of Food in the Last Year: Never true    Ran Out of Food in the Last Year: Never true  Transportation Needs: No Transportation Needs (07/01/2023)   PRAPARE - Administrator, Civil Service (Medical): No    Lack of Transportation (Non-Medical): No  Physical Activity: Insufficiently Active (07/01/2023)   Exercise Vital Sign    Days of Exercise per Week: 3 days    Minutes of Exercise per Session: 30 min  Stress: No Stress Concern Present (07/01/2023)   [employer] of Occupational Health - Occupational Stress Questionnaire    Feeling of Stress : Not at all  Social Connections: Moderately Isolated (07/01/2023)   Social Connection and Isolation Panel [NHANES]    Frequency of Communication with Friends and Family: More than three times a week    Frequency of Social Gatherings with Friends and Family: Once a week    Attends Religious Services: Never  Active Member of Clubs or Organizations: No    Attends Banker Meetings: Never    Marital Status: Married  Catering manager Violence: Not At Risk (07/01/2023)   Humiliation, Afraid, Rape, and Kick questionnaire    Fear of Current or Ex-Partner: No    Emotionally Abused: No    Physically Abused: No    Sexually Abused: No   Family Status  Relation Name Status   Mother  Deceased at age 66   Father  Deceased at age 36   Sister  Deceased at age 52   Sister  Deceased at age 88   Sister  Alive   Brother  Deceased   Brother  Alive   Neg Hx  (Not Specified)  No partnership data on file    Family History  Problem Relation Age of Onset   Kidney failure Mother    Stroke Mother    Diabetes Mother    Emphysema Father    Hypertension Father    Aneurysm Sister        Cerebral   Cervical cancer Sister 35   Healthy Sister    Bladder Cancer Brother 55   Healthy Brother    Breast cancer Neg Hx    Allergies  Allergen Reactions   Codeine Itching    Patient Care Team: Malva Limes, MD as PCP - General (Family Medicine) Kieth Brightly, MD (General Surgery) Bud Face, MD as Referring Physician (Otolaryngology) Deeann Saint, MD (Orthopedic Surgery) Toney Reil, MD as Consulting Physician (Gastroenterology)   Medications: Outpatient Medications Prior to Visit  Medication Sig   ascorbic acid (VITAMIN C) 1000 MG tablet Take 1,000 mg by mouth daily.   aspirin 81 MG tablet Take 81 mg by mouth daily.   CALCIUM PO Take by mouth daily.   cetirizine (ZYRTEC) 10 MG tablet Zyrtec 10 mg tablet   1 tablet every other day by oral route.   Cholecalciferol (VITAMIN D) 125 MCG (5000 UT) CAPS Take 1 capsule by mouth daily.   Cyanocobalamin (VITAMIN B-12 PO) Take by mouth daily.   dexlansoprazole (DEXILANT) 60 MG capsule Take 1 capsule (60 mg total) by mouth daily.   loratadine (CLARITIN) 10 MG tablet Take 10 mg by mouth daily.   montelukast (SINGULAIR) 10 MG tablet Take 1 tablet (10 mg total) by mouth daily.   Potassium 99 MG TABS Take 99 mg by mouth.   No facility-administered medications prior to visit.    Review of Systems {Insert previous labs (optional):23779} {See past labs  Heme  Chem  Endocrine  Serology  Results Review (optional):1}  Objective    BP 136/67 (BP Location: Right Arm, Patient Position: Sitting, Cuff Size: Normal)   Pulse 85   Ht 5\' 3"  (1.6 m)   Wt 180 lb (81.6 kg)   SpO2 97%   BMI 31.89 kg/m  {Insert last BP/Wt (optional):23777}{See vitals history (optional):1}  Physical Exam  ***  Last depression screening  scores    07/01/2023    2:20 PM 12/26/2022    1:17 PM 10/15/2022    9:10 AM  PHQ 2/9 Scores  PHQ - 2 Score 0 0 0  PHQ- 9 Score  0 0   Last fall risk screening    07/01/2023    2:17 PM  Fall Risk   Falls in the past year? 0  Number falls in past yr: 0  Injury with Fall? 0  Risk for fall due to : No Fall Risks  Follow up Education  provided;Falls prevention discussed   Last Audit-C alcohol use screening    07/01/2023    2:20 PM  Alcohol Use Disorder Test (AUDIT)  1. How often do you have a drink containing alcohol? 0  2. How many drinks containing alcohol do you have on a typical day when you are drinking? 0  3. How often do you have six or more drinks on one occasion? 0  AUDIT-C Score 0   A score of 3 or more in women, and 4 or more in men indicates increased risk for alcohol abuse, EXCEPT if all of the points are from question 1   No results found for any visits on 10/17/23.  Assessment & Plan    Routine Health Maintenance and Physical Exam  Exercise Activities and Dietary recommendations  Goals   None      There is no immunization history on file for this patient.  Health Maintenance  Topic Date Due   Pneumonia Vaccine 30+ Years old (1 of 2 - PCV) Never done   DTaP/Tdap/Td (1 - Tdap) Never done   Zoster Vaccines- Shingrix (1 of 2) Never done   COVID-19 Vaccine (1 - 2023-24 season) Never done   Lung Cancer Screening  11/12/2023   INFLUENZA VACCINE  03/01/2024 (Originally 07/03/2023)   MAMMOGRAM  05/25/2024   Medicare Annual Wellness (AWV)  06/30/2024   Colonoscopy  12/19/2026   DEXA SCAN  Completed   Hepatitis C Screening  Completed   HPV VACCINES  Aged Out    Discussed health benefits of physical activity, and encouraged her to engage in regular exercise appropriate for her age and condition.  ***  No follow-ups on file.     {provider attestation***:1}   Mila Merry, MD  [facility] [phone] (phone) [phone]  (fax)  [facility]

## 2023-10-17 NOTE — Patient Instructions (Addendum)
Please review the attached list of medications and notify my office if there are any errors.   It is recommended to engage in 150 minutes of moderate exercise every week.   I recommend that you get the Prevnar 20 vaccine to protect yourself from certain dangerous strains of pneumonia. You can get Prevnar 20 at your pharmacy, or call our office at [phone] at your earliest convenience to schedule this vaccine.     You are due for a Tdap (tetanus-diptheria-pertussis vaccine) which protects you from tetanus and whooping cough. Please check with your insurance plan or pharmacy regarding coverage for this vaccine.

## 2023-10-18 LAB — COMPREHENSIVE METABOLIC PANEL
ALT: 8 [IU]/L (ref 0–32)
AST: 18 [IU]/L (ref 0–40)
Albumin: 4.3 g/dL (ref 3.9–4.9)
Alkaline Phosphatase: 159 [IU]/L — ABNORMAL HIGH (ref 44–121)
BUN/Creatinine Ratio: 20 (ref 12–28)
BUN: 20 mg/dL (ref 8–27)
Bilirubin Total: 0.3 mg/dL (ref 0.0–1.2)
CO2: 21 mmol/L (ref 20–29)
Calcium: 9.8 mg/dL (ref 8.7–10.3)
Chloride: 105 mmol/L (ref 96–106)
Creatinine, Ser: 1 mg/dL (ref 0.57–1.00)
Globulin, Total: 2.7 g/dL (ref 1.5–4.5)
Glucose: 93 mg/dL (ref 70–99)
Potassium: 5.5 mmol/L — ABNORMAL HIGH (ref 3.5–5.2)
Sodium: 142 mmol/L (ref 134–144)
Total Protein: 7 g/dL (ref 6.0–8.5)
eGFR: 62 mL/min/{1.73_m2} (ref >59)

## 2023-10-18 LAB — LIPID PANEL
Chol/HDL Ratio: 2.5 ratio (ref 0.0–4.4)
Cholesterol, Total: 130 mg/dL (ref 100–199)
HDL: 51 mg/dL (ref >39)
LDL Chol Calc (NIH): 55 mg/dL (ref 0–99)
Triglycerides: 137 mg/dL (ref 0–149)
VLDL Cholesterol Cal: 24 mg/dL (ref 5–40)

## 2023-10-18 LAB — CBC WITH DIFFERENTIAL/PLATELET
Basophils Absolute: 0.1 10*3/uL (ref 0.0–0.2)
Basos: 0 %
EOS (ABSOLUTE): 0.4 10*3/uL (ref 0.0–0.4)
Eos: 3 %
Hematocrit: 44.8 % (ref 34.0–46.6)
Hemoglobin: 15 g/dL (ref 11.1–15.9)
Immature Grans (Abs): 0.1 10*3/uL (ref 0.0–0.1)
Immature Granulocytes: 1 %
Lymphocytes Absolute: 2 10*3/uL (ref 0.7–3.1)
Lymphs: 18 %
MCH: 32.7 pg (ref 26.6–33.0)
MCHC: 33.5 g/dL (ref 31.5–35.7)
MCV: 98 fL — ABNORMAL HIGH (ref 79–97)
Monocytes Absolute: 1.1 10*3/uL — ABNORMAL HIGH (ref 0.1–0.9)
Monocytes: 10 %
Neutrophils Absolute: 7.6 10*3/uL — ABNORMAL HIGH (ref 1.4–7.0)
Neutrophils: 68 %
Platelets: 349 10*3/uL (ref 150–450)
RBC: 4.59 x10E6/uL (ref 3.77–5.28)
RDW: 13.4 % (ref 11.7–15.4)
WBC: 11.2 10*3/uL — ABNORMAL HIGH (ref 3.4–10.8)

## 2023-10-18 LAB — VITAMIN D 25 HYDROXY (VIT D DEFICIENCY, FRACTURES): Vit D, 25-Hydroxy: 36.8 ng/mL (ref 30.0–100.0)

## 2023-10-18 LAB — HEMOGLOBIN A1C
Est. average glucose Bld gHb Est-mCnc: 123 mg/dL
Hgb A1c MFr Bld: 5.9 % — ABNORMAL HIGH (ref 4.8–5.6)

## 2023-10-18 LAB — TSH: TSH: 4.21 u[IU]/mL (ref 0.450–4.500)

## 2023-10-20 DIAGNOSIS — G5603 Carpal tunnel syndrome, bilateral upper limbs: Secondary | ICD-10-CM | POA: Diagnosis not present

## 2023-10-20 DIAGNOSIS — M7541 Impingement syndrome of right shoulder: Secondary | ICD-10-CM | POA: Diagnosis not present

## 2023-10-24 ENCOUNTER — Other Ambulatory Visit (HOSPITAL_COMMUNITY): Payer: Self-pay

## 2023-11-03 DIAGNOSIS — G5603 Carpal tunnel syndrome, bilateral upper limbs: Secondary | ICD-10-CM | POA: Diagnosis not present

## 2023-11-03 DIAGNOSIS — M7541 Impingement syndrome of right shoulder: Secondary | ICD-10-CM | POA: Diagnosis not present

## 2023-11-13 ENCOUNTER — Ambulatory Visit
Admission: RE | Admit: 2023-11-13 | Discharge: 2023-11-13 | Disposition: A | Discharge status: Home / Self Care | Payer: PPO | Source: Ambulatory Visit | Attending: Body Imaging | Admitting: Body Imaging

## 2023-11-13 DIAGNOSIS — Z122 Encounter for screening for malignant neoplasm of respiratory organs: Secondary | ICD-10-CM | POA: Insufficient documentation

## 2023-11-13 DIAGNOSIS — Z87891 Personal history of nicotine dependence: Secondary | ICD-10-CM | POA: Diagnosis not present

## 2023-11-13 DIAGNOSIS — I700 Atherosclerosis of aorta: Secondary | ICD-10-CM | POA: Insufficient documentation

## 2023-11-13 DIAGNOSIS — J432 Centrilobular emphysema: Secondary | ICD-10-CM | POA: Insufficient documentation

## 2023-11-13 DIAGNOSIS — I2510 Atherosclerotic heart disease of native coronary artery without angina pectoris: Secondary | ICD-10-CM | POA: Insufficient documentation

## 2023-11-27 ENCOUNTER — Other Ambulatory Visit: Payer: Self-pay | Admitting: Acute Care

## 2023-11-27 DIAGNOSIS — Z122 Encounter for screening for malignant neoplasm of respiratory organs: Secondary | ICD-10-CM

## 2023-11-27 DIAGNOSIS — Z87891 Personal history of nicotine dependence: Secondary | ICD-10-CM

## 2023-12-15 DIAGNOSIS — L304 Erythema intertrigo: Secondary | ICD-10-CM | POA: Diagnosis not present

## 2023-12-15 DIAGNOSIS — L570 Actinic keratosis: Secondary | ICD-10-CM | POA: Diagnosis not present

## 2023-12-15 DIAGNOSIS — L918 Other hypertrophic disorders of the skin: Secondary | ICD-10-CM | POA: Diagnosis not present

## 2024-01-13 ENCOUNTER — Other Ambulatory Visit (HOSPITAL_COMMUNITY): Payer: Self-pay

## 2024-01-13 ENCOUNTER — Other Ambulatory Visit: Payer: Self-pay

## 2024-01-23 ENCOUNTER — Other Ambulatory Visit: Payer: Self-pay

## 2024-01-30 ENCOUNTER — Encounter: Payer: Self-pay | Admitting: Family Medicine

## 2024-01-30 DIAGNOSIS — F17210 Nicotine dependence, cigarettes, uncomplicated: Secondary | ICD-10-CM

## 2024-01-30 MED ORDER — VARENICLINE TARTRATE (STARTER) 0.5 MG X 11 & 1 MG X 42 PO TBPK: ORAL_TABLET | ORAL | 0 refills | Status: AC

## 2024-02-06 DIAGNOSIS — G4733 Obstructive sleep apnea (adult) (pediatric): Secondary | ICD-10-CM | POA: Diagnosis not present

## 2024-03-01 ENCOUNTER — Other Ambulatory Visit: Payer: Self-pay | Admitting: Family Medicine

## 2024-03-01 DIAGNOSIS — F17210 Nicotine dependence, cigarettes, uncomplicated: Secondary | ICD-10-CM

## 2024-03-01 MED ORDER — VARENICLINE TARTRATE 1 MG PO TABS: 1.0000 mg | ORAL_TABLET | Freq: Two times a day (BID) | ORAL | 4 refills | Status: DC

## 2024-04-05 ENCOUNTER — Encounter: Payer: Self-pay | Admitting: Family Medicine

## 2024-04-05 ENCOUNTER — Ambulatory Visit (INDEPENDENT_AMBULATORY_CARE_PROVIDER_SITE_OTHER): Admitting: Family Medicine

## 2024-04-05 VITALS — BP 135/75 | HR 86 | Resp 16 | Ht 63.5 in | Wt 187.8 lb

## 2024-04-05 DIAGNOSIS — L309 Dermatitis, unspecified: Secondary | ICD-10-CM | POA: Diagnosis not present

## 2024-04-05 MED ORDER — TRIAMCINOLONE ACETONIDE 0.5 % EX CREA: 1.0000 | TOPICAL_CREAM | Freq: Two times a day (BID) | CUTANEOUS | 0 refills | Status: DC

## 2024-04-05 MED ORDER — KETOCONAZOLE 2 % EX CREA: 1.0000 | TOPICAL_CREAM | Freq: Two times a day (BID) | CUTANEOUS | 0 refills | Status: DC

## 2024-04-05 NOTE — Progress Notes (Signed)
      Established patient visit   Patient: Theresa White   DOB: 28-Jul-1956   68 y.o. Female  MRN: [id number] Visit Date: 04/05/2024  Today's healthcare provider: Jeralene Mom, MD   Chief Complaint  Patient presents with   Ear Drainage    Frequency: x 1 week Tried Neosporin, Hydrocortisone 2%   Subjective    Discussed the use of AI scribe software for clinical note transcription with the patient, who gave verbal consent to proceed.  History of Present Illness   Theresa White is a 68 year old female who presents with bilateral ear crusting and drainage.  She has been experiencing bilateral ear crusting and drainage for about a week, with the left ear being more affected than the right. She is concerned about potential bleeding if the condition remains untreated.  She has been applying Neosporin and 2% hydrocortisone cream to the affected areas, but these treatments have not improved her symptoms.  She recalls a similar episode over three years ago, for which she was treated by an ear, nose, and throat specialist with a cream, possibly a stronger steroid and antibiotics. However, she does not remember the specific medication used at that time.  She attempted to schedule an appointment with the same specialist but was unable to get an appointment until later in the month, prompting her to seek care today.       Medications: Outpatient Medications Prior to Visit  Medication Sig   ascorbic acid (VITAMIN C) 1000 MG tablet Take 1,000 mg by mouth daily.   aspirin 81 MG tablet Take 81 mg by mouth daily.   CALCIUM PO Take by mouth daily.   cetirizine (ZYRTEC) 10 MG tablet Zyrtec 10 mg tablet   1 tablet every other day by oral route.   Cholecalciferol (VITAMIN D ) 125 MCG (5000 UT) CAPS Take 1 capsule by mouth daily.   Cyanocobalamin (VITAMIN B-12 PO) Take by mouth daily.   loratadine (CLARITIN) 10 MG tablet Take 10 mg by mouth daily.   montelukast  (SINGULAIR ) 10 MG tablet Take 1 tablet  (10 mg total) by mouth daily.   Potassium 99 MG TABS Take 99 mg by mouth.   RABEprazole  (ACIPHEX ) 20 MG tablet Take 1 tablet (20 mg total) by mouth daily.   [DISCONTINUED] varenicline  (CHANTIX  CONTINUING MONTH PAK) 1 MG tablet Take 1 tablet (1 mg total) by mouth 2 (two) times daily.   No facility-administered medications prior to visit.   Review of Systems     Objective    BP 135/75 (BP Location: Left Arm, Patient Position: Sitting, Cuff Size: Normal)   Pulse 86   Resp 16   Ht 5' 3.5" (1.613 m)   Wt 187 lb 12.8 oz (85.2 kg)   SpO2 98%   BMI 32.75 kg/m    Physical Exam   HEENT: Crusting and drainage in external auditory canals bilaterally, worse on left    No results found for any visits on 04/05/24.  Assessment & Plan        Bilateral ear dermatitis Bilateral ear dermatitis with crusting, itching, and discharge, unresponsive to Neosporin and 2% hydrocortisone cream. - Prescribed 0.5% TAC and 2% ketoconazole  twice a day.  - Keep follow up appt with ENT if not resolved by then.         Jeralene Mom, MD  [facility] [phone] (phone) [phone] (fax)  [facility]

## 2024-04-08 ENCOUNTER — Other Ambulatory Visit (HOSPITAL_COMMUNITY): Payer: Self-pay

## 2024-04-08 ENCOUNTER — Other Ambulatory Visit: Payer: Self-pay

## 2024-04-19 ENCOUNTER — Other Ambulatory Visit (HOSPITAL_COMMUNITY): Payer: Self-pay

## 2024-04-23 DIAGNOSIS — M7541 Impingement syndrome of right shoulder: Secondary | ICD-10-CM | POA: Diagnosis not present

## 2024-05-26 ENCOUNTER — Encounter: Payer: Self-pay | Admitting: Family Medicine

## 2024-05-26 DIAGNOSIS — Z1231 Encounter for screening mammogram for malignant neoplasm of breast: Secondary | ICD-10-CM | POA: Diagnosis not present

## 2024-05-26 LAB — HM MAMMOGRAPHY

## 2024-06-09 DIAGNOSIS — H524 Presbyopia: Secondary | ICD-10-CM | POA: Diagnosis not present

## 2024-06-09 DIAGNOSIS — H2513 Age-related nuclear cataract, bilateral: Secondary | ICD-10-CM | POA: Diagnosis not present

## 2024-06-09 DIAGNOSIS — H01006 Unspecified blepharitis left eye, unspecified eyelid: Secondary | ICD-10-CM | POA: Diagnosis not present

## 2024-06-09 DIAGNOSIS — H01003 Unspecified blepharitis right eye, unspecified eyelid: Secondary | ICD-10-CM | POA: Diagnosis not present

## 2024-06-09 DIAGNOSIS — H52223 Regular astigmatism, bilateral: Secondary | ICD-10-CM | POA: Diagnosis not present

## 2024-06-09 DIAGNOSIS — H5203 Hypermetropia, bilateral: Secondary | ICD-10-CM | POA: Diagnosis not present

## 2024-06-09 DIAGNOSIS — Z135 Encounter for screening for eye and ear disorders: Secondary | ICD-10-CM | POA: Diagnosis not present

## 2024-07-06 ENCOUNTER — Ambulatory Visit (INDEPENDENT_AMBULATORY_CARE_PROVIDER_SITE_OTHER): Payer: PPO

## 2024-07-06 DIAGNOSIS — Z0000 Encounter for general adult medical examination without abnormal findings: Secondary | ICD-10-CM | POA: Diagnosis not present

## 2024-07-06 DIAGNOSIS — F17210 Nicotine dependence, cigarettes, uncomplicated: Secondary | ICD-10-CM

## 2024-07-06 NOTE — Patient Instructions (Addendum)
 Ms. Theresa White , Thank you for taking time out of your busy schedule to complete your Annual Wellness Visit with me. I enjoyed our conversation and look forward to speaking with you again next year. I, as well as your care team,  appreciate your ongoing commitment to your health goals. Please review the following plan we discussed and let me know if I can assist you in the future.  REFERRAL SENT FOR LUNG CANCER SCREENING  Follow up Visits: 07/12/25 @ 12:30 PM BY PHONE We will see or speak with you next year for your Next Medicare AWV with our clinical staff Have you seen your provider in the last 6 months (3 months if uncontrolled diabetes)? Yes  Clinician Recommendations:  Aim for 30 minutes of exercise or brisk walking, 6-8 glasses of water, and 5 servings of fruits and vegetables each day. TAKE CARE! JUST KEEP SWIMMIN'      This is a list of the screenings recommended for you:  Health Maintenance  Topic Date Due   DTaP/Tdap/Td vaccine (1 - Tdap) Never done   Pneumococcal Vaccine for age over 75 (1 of 2 - PCV) Never done   Zoster (Shingles) Vaccine (1 of 2) Never done   COVID-19 Vaccine (1 - 2024-25 season) Never done   Flu Shot  07/02/2024   Screening for Lung Cancer  11/12/2024   Mammogram  05/26/2025   Medicare Annual Wellness Visit  07/06/2025   DEXA scan (bone density measurement)  10/23/2026   Colon Cancer Screening  12/19/2026   Hepatitis C Screening  Completed   Hepatitis B Vaccine  Aged Out   HPV Vaccine  Aged Out   Meningitis B Vaccine  Aged Out    Advanced directives: (ACP Link)Information on Advanced Care Planning can be found at [place]  Print production planner Health Care Directives Advance Health Care Directives. [email or web address]  Advance Care Planning is important because it:  [x]  Makes sure you receive the medical care that is consistent with your values, goals, and preferences  [x]  It provides guidance to your family and loved ones and reduces their decisional  burden about whether or not they are making the right decisions based on your wishes.  Follow the link provided in your after visit summary or read over the paperwork we have mailed to you to help you started getting your Advance Directives in place. If you need assistance in completing these, please reach out to us  so that we can help you!

## 2024-07-06 NOTE — Progress Notes (Signed)
 Subjective:   Theresa White is a 68 y.o. who presents for a Medicare Wellness preventive visit.  As a reminder, Annual Wellness Visits don't include a physical exam, and some assessments may be limited, especially if this visit is performed virtually. We may recommend an in-person follow-up visit with your provider if needed.  Visit Complete: Virtual I connected with  SAAVI MCEACHRON on 07/06/24 by a audio enabled telemedicine application and verified that I am speaking with the correct person using two identifiers.  Patient Location: Home  Provider Location: Office/Clinic  I discussed the limitations of evaluation and management by telemedicine. The patient expressed understanding and agreed to proceed.  Vital Signs: Because this visit was a virtual/telehealth visit, some criteria may be missing or patient reported. Any vitals not documented were not able to be obtained and vitals that have been documented are patient reported.  VideoDeclined- This patient declined Librarian, academic. Therefore the visit was completed with audio only.  Persons Participating in Visit: Patient.  AWV Questionnaire: No: Patient Medicare AWV questionnaire was not completed prior to this visit.  Cardiac Risk Factors include: advanced age (>88men, >69 women);obesity (BMI >30kg/m2);smoking/ tobacco exposure     Objective:    There were no vitals filed for this visit. There is no height or weight on file to calculate BMI.     07/06/2024    2:01 PM 07/01/2023    2:24 PM  Advanced Directives  Does Patient Have a Medical Advance Directive? No No  Would patient like information on creating a medical advance directive? No - Patient declined     Current Medications (verified) Outpatient Encounter Medications as of 07/06/2024  Medication Sig   ascorbic acid (VITAMIN C) 1000 MG tablet Take 1,000 mg by mouth daily.   aspirin 81 MG tablet Take 81 mg by mouth daily.   cetirizine  (ZYRTEC) 10 MG tablet Zyrtec 10 mg tablet   1 tablet every other day by oral route.   Cholecalciferol (VITAMIN D ) 125 MCG (5000 UT) CAPS Take 1 capsule by mouth daily.   Cyanocobalamin (VITAMIN B-12 PO) Take by mouth daily.   loratadine (CLARITIN) 10 MG tablet Take 10 mg by mouth daily.   montelukast  (SINGULAIR ) 10 MG tablet Take 1 tablet (10 mg total) by mouth daily.   Potassium 99 MG TABS Take 99 mg by mouth.   RABEprazole  (ACIPHEX ) 20 MG tablet Take 1 tablet (20 mg total) by mouth daily.   CALCIUM PO Take by mouth daily. (Patient not taking: Reported on 07/06/2024)   [DISCONTINUED] ketoconazole  (NIZORAL ) 2 % cream Apply 1 Application topically 2 (two) times daily.   [DISCONTINUED] triamcinolone  cream (KENALOG ) 0.5 % Apply 1 Application topically 2 (two) times daily.   No facility-administered encounter medications on file as of 07/06/2024.    Allergies (verified) Codeine   History: Past Medical History:  Diagnosis Date   Bowel trouble 1995   Diffuse cystic mastopathy 2012   Tension headache    Past Surgical History:  Procedure Laterality Date   ABDOMINAL EXPLORATION SURGERY     BLEPHAROPLASTY Bilateral 06/2021   COLONOSCOPY WITH PROPOFOL  N/A 12/20/2019   Procedure: COLONOSCOPY WITH PROPOFOL ;  Surgeon: Unk Corinn Skiff, MD;  Location: [facility] ENDOSCOPY;  Service: Gastroenterology;  Laterality: N/A;   FOOT SURGERY Left 2010   TONSILLECTOMY AND ADENOIDECTOMY     TOTAL ABDOMINAL HYSTERECTOMY W/ BILATERAL SALPINGOOPHORECTOMY  1996   Laparoscopically assisted for pelvic pain   Family History  Problem Relation Age of  Onset   Kidney failure Mother    Stroke Mother    Diabetes Mother    Emphysema Father    Hypertension Father    Aneurysm Sister        Cerebral   Cervical cancer Sister 22   Healthy Sister    Bladder Cancer Brother 3   Healthy Brother    Breast cancer Neg Hx    Social History   Socioeconomic History   Marital status: Married    Spouse name: Not on file    Number of children: 2   Years of education: Not on file   Highest education level: Not on file  Occupational History   Occupation: Production planning  Tobacco Use   Smoking status: Some Days    Current packs/day: 0.00    Average packs/day: 0.8 packs/day for 25.0 years (18.8 ttl pk-yrs)    Types: Cigarettes    Start date: 11/02/1995    Last attempt to quit: 11/01/2020    Years since quitting: 3.6   Smokeless tobacco: Never   Tobacco comments:    Started as teenager, averaging 1 ppd since  Vaping Use   Vaping status: Never Used  Substance and Sexual Activity   Alcohol use: No    Alcohol/week: 0.0 standard drinks of alcohol   Drug use: No   Sexual activity: Not on file  Other Topics Concern   Not on file  Social History Narrative   Not on file   Social Drivers of Health   Financial Resource Strain: Low Risk  (07/06/2024)   Overall Financial Resource Strain (CARDIA)    Difficulty of Paying Living Expenses: Not hard at all  Food Insecurity: No Food Insecurity (07/06/2024)   Hunger Vital Sign    Worried About Running Out of Food in the Last Year: Never true    Ran Out of Food in the Last Year: Never true  Transportation Needs: No Transportation Needs (07/06/2024)   PRAPARE - Administrator, Civil Service (Medical): No    Lack of Transportation (Non-Medical): No  Physical Activity: Insufficiently Active (07/06/2024)   Exercise Vital Sign    Days of Exercise per Week: 2 days    Minutes of Exercise per Session: 60 min  Stress: No Stress Concern Present (07/06/2024)   [employer] of Occupational Health - Occupational Stress Questionnaire    Feeling of Stress: Not at all  Social Connections: Moderately Integrated (07/06/2024)   Social Connection and Isolation Panel    Frequency of Communication with Friends and Family: More than three times a week    Frequency of Social Gatherings with Friends and Family: Twice a week    Attends Religious Services: More than 4 times per  year    Active Member of [employer] or Organizations: No    Attends Engineer, structural: Never    Marital Status: Married    Tobacco Counseling Ready to quit: Not Answered Counseling given: Not Answered Tobacco comments: Started as teenager, averaging 1 ppd since    Clinical Intake:  Pre-visit preparation completed: Yes  Pain : No/denies pain     BMI - recorded: 32.6 Nutritional Status: BMI > 30  Obese Nutritional Risks: None Diabetes: No  Lab Results  Component Value Date   HGBA1C 5.9 (H) 10/17/2023     How often do you need to have someone help you when you read instructions, pamphlets, or other written materials from your doctor or pharmacy?: 1 - Never  Interpreter Needed?: No  Information  entered by :: JHONNIE DAS, LPN   Activities of Daily Living     07/06/2024    2:02 PM 07/06/2024    9:12 AM  In your present state of health, do you have any difficulty performing the following activities:  Hearing? 0 0  Vision? 0 0  Difficulty concentrating or making decisions? 0 0  Walking or climbing stairs? 0 0  Dressing or bathing? 0 0  Doing errands, shopping? 0 0  Preparing Food and eating ? N N  Using the Toilet? N N  In the past six months, have you accidently leaked urine? Y Y  Do you have problems with loss of bowel control? N N  Managing your Medications? N N  Managing your Finances? N N  Housekeeping or managing your Housekeeping? N N    Patient Care Team: Gasper Nancyann BRAVO, MD as PCP - General (Family Medicine) Dellie Louanne MATSU, MD (General Surgery) Milissa Hamming, MD as Referring Physician (Otolaryngology) Cleotilde Barrio, MD (Orthopedic Surgery) Unk Corinn Skiff, MD as Consulting Physician (Gastroenterology) Laurice Francis NOVAK, OD (Optometry)  I have updated your Care Teams any recent Medical Services you may have received from other providers in the past year.     Assessment:   This is a routine wellness examination for  Theresa White.  Hearing/Vision screen Hearing Screening - Comments:: NO AIDS Vision Screening - Comments:: WEARS GLASSES ALL DAY- DR.NICE- HAD VISIT SEVERAL WEEKS AGO   Goals Addressed             This Visit's Progress    DIET - EAT MORE FRUITS AND VEGETABLES         Depression Screen     07/06/2024    2:00 PM 04/05/2024   10:34 AM 07/01/2023    2:20 PM 12/26/2022    1:17 PM 10/15/2022    9:10 AM 10/10/2021    9:09 AM 09/19/2020    2:09 PM  PHQ 2/9 Scores  PHQ - 2 Score 0 0 0 0 0 0 0  PHQ- 9 Score 0   0 0 2 0    Fall Risk     07/06/2024    9:12 AM 06/29/2024    5:00 PM 04/05/2024   10:34 AM 07/01/2023    2:17 PM 12/26/2022    1:17 PM  Fall Risk   Falls in the past year? 0 0 0 0 0  Number falls in past yr: 0  0 0 0  Injury with Fall? 0  0 0 0  Risk for fall due to : No Fall Risks  No Fall Risks No Fall Risks No Fall Risks  Follow up Falls evaluation completed;Falls prevention discussed   Education provided;Falls prevention discussed Falls evaluation completed    MEDICARE RISK AT HOME:  Medicare Risk at Home Any stairs in or around the home?: Yes If so, are there any without handrails?: No Home free of loose throw rugs in walkways, pet beds, electrical cords, etc?: Yes Adequate lighting in your home to reduce risk of falls?: Yes Life alert?: No Use of a cane, walker or w/c?: No Grab bars in the bathroom?: No Shower chair or bench in shower?: Yes Elevated toilet seat or a handicapped toilet?: No  TIMED UP AND GO:  Was the test performed?  No  Cognitive Function: 6CIT completed        07/01/2023    2:26 PM 10/10/2021    9:06 AM  6CIT Screen  What Year? 0 points 0 points  What  month? 0 points 0 points  What time? 0 points 0 points  Count back from 20 0 points 0 points  Months in reverse 0 points 0 points  Repeat phrase 0 points 0 points  Total Score 0 points 0 points    Immunizations  There is no immunization history on file for this patient.  Screening  Tests Health Maintenance  Topic Date Due   DTaP/Tdap/Td (1 - Tdap) Never done   Pneumococcal Vaccine: 50+ Years (1 of 2 - PCV) Never done   Zoster Vaccines- Shingrix (1 of 2) Never done   COVID-19 Vaccine (1 - 2024-25 season) Never done   INFLUENZA VACCINE  07/02/2024   Lung Cancer Screening  11/12/2024   MAMMOGRAM  05/26/2025   Medicare Annual Wellness (AWV)  07/06/2025   DEXA SCAN  10/23/2026   Colonoscopy  12/19/2026   Hepatitis C Screening  Completed   Hepatitis B Vaccines  Aged Out   HPV VACCINES  Aged Out   Meningococcal B Vaccine  Aged Out    Health Maintenance  Health Maintenance Due  Topic Date Due   DTaP/Tdap/Td (1 - Tdap) Never done   Pneumococcal Vaccine: 50+ Years (1 of 2 - PCV) Never done   Zoster Vaccines- Shingrix (1 of 2) Never done   COVID-19 Vaccine (1 - 2024-25 season) Never done   INFLUENZA VACCINE  07/02/2024   Health Maintenance Items Addressed: UP TO DATE ON MAMMOGRAM, COLONOSCOPY & BDS; DECLINES ALL SHOTS; LUNG CA SCREENING REFERRAL SENT  Additional Screening:  Vision Screening: Recommended annual ophthalmology exams for early detection of glaucoma and other disorders of the eye. Would you like a referral to an eye doctor? No    Dental Screening: Recommended annual dental exams for proper oral hygiene  Community Resource Referral / Chronic Care Management: CRR required this visit?  No   CCM required this visit?  No   Plan:    I have personally reviewed and noted the following in the patient's chart:   Medical and social history Use of alcohol, tobacco or illicit drugs  Current medications and supplements including opioid prescriptions. Patient is not currently taking opioid prescriptions. Functional ability and status Nutritional status Physical activity Advanced directives List of other physicians Hospitalizations, surgeries, and ER visits in previous 12 months Vitals Screenings to include cognitive, depression, and  falls Referrals and appointments  In addition, I have reviewed and discussed with patient certain preventive protocols, quality metrics, and best practice recommendations. A written personalized care plan for preventive services as well as general preventive health recommendations were provided to patient.   Jhonnie GORMAN Das, LPN   12/05/7972   After Visit Summary: (MyChart) Due to this being a telephonic visit, the after visit summary with patients personalized plan was offered to patient via MyChart   Notes: LUNG CA SCREENING REFERRAL SENT

## 2024-08-26 DIAGNOSIS — H01004 Unspecified blepharitis left upper eyelid: Secondary | ICD-10-CM | POA: Diagnosis not present

## 2024-08-26 DIAGNOSIS — H01001 Unspecified blepharitis right upper eyelid: Secondary | ICD-10-CM | POA: Diagnosis not present

## 2024-08-26 DIAGNOSIS — H2513 Age-related nuclear cataract, bilateral: Secondary | ICD-10-CM | POA: Diagnosis not present

## 2024-09-21 ENCOUNTER — Other Ambulatory Visit (HOSPITAL_COMMUNITY): Payer: Self-pay

## 2024-09-22 ENCOUNTER — Ambulatory Visit: Admitting: Family Medicine

## 2024-09-22 ENCOUNTER — Encounter: Payer: Self-pay | Admitting: Family Medicine

## 2024-09-22 VITALS — BP 140/81 | HR 91 | Wt 186.9 lb

## 2024-09-22 DIAGNOSIS — K5792 Diverticulitis of intestine, part unspecified, without perforation or abscess without bleeding: Secondary | ICD-10-CM | POA: Diagnosis not present

## 2024-09-22 MED ORDER — CIPROFLOXACIN HCL 500 MG PO TABS: 500.0000 mg | ORAL_TABLET | Freq: Two times a day (BID) | ORAL | 0 refills | Status: AC

## 2024-09-22 MED ORDER — METRONIDAZOLE 500 MG PO TABS: 500.0000 mg | ORAL_TABLET | Freq: Two times a day (BID) | ORAL | 0 refills | Status: AC

## 2024-09-22 NOTE — Progress Notes (Signed)
 Established patient visit   Patient: Theresa White   DOB: [birth year]   68 y.o. Female  MRN: [id number] Visit Date: 09/22/2024  Today's healthcare provider: Nancyann Perry, MD   Chief Complaint  Patient presents with   Abdominal Pain    Feels like she is having a diverticulitis flare up. She is having diarrhea and chills.   Subjective    Discussed the use of AI scribe software for clinical note transcription with the patient, who gave verbal consent to proceed.  History of Present Illness   Theresa White is a 68 year old female with history of diverticulitis who presents with a suspected flare-up of diverticulitis.  She is experiencing explosive diarrhea and excruciating pain during bowel movements. The pain is diffuse but predominantly located in the lower abdomen. It subsides slightly when she remains still but intensifies with movement, making it painful to walk.  She has been on a restricted diet since Saturday, consuming only soup, cream potatoes, and eggs. No blood in stool, nausea, or vomiting currently, although she experienced nausea a couple of weeks ago, which led her to consume only saltine crackers and Diet Coke to settle her stomach.  In the past, she has been treated with antibiotics such as Cipro  for diverticulitis, which improved her symptoms despite causing general malaise. She has not taken any antibiotics recently for this episode.  No one else at home has been sick, and she has not experienced any significant tenderness upon self-examination of her abdomen.       Medications: Outpatient Medications Prior to Visit  Medication Sig   ascorbic acid (VITAMIN C) 1000 MG tablet Take 1,000 mg by mouth daily.   aspirin 81 MG tablet Take 81 mg by mouth daily.   CALCIUM PO Take by mouth daily.   cetirizine (ZYRTEC) 10 MG tablet Zyrtec 10 mg tablet   1 tablet every other day by oral route.   Cholecalciferol (VITAMIN D ) 125 MCG (5000 UT) CAPS Take 1 capsule by mouth  daily.   Cyanocobalamin (VITAMIN B-12 PO) Take by mouth daily.   loratadine (CLARITIN) 10 MG tablet Take 10 mg by mouth daily.   montelukast  (SINGULAIR ) 10 MG tablet Take 1 tablet (10 mg total) by mouth daily.   Potassium 99 MG TABS Take 99 mg by mouth.   RABEprazole  (ACIPHEX ) 20 MG tablet Take 1 tablet (20 mg total) by mouth daily.   No facility-administered medications prior to visit.   Review of Systems  Constitutional:  Negative for appetite change, chills, fatigue and fever.  Respiratory:  Negative for chest tightness and shortness of breath.   Cardiovascular:  Negative for chest pain and palpitations.  Gastrointestinal:  Negative for abdominal pain, nausea and vomiting.  Neurological:  Negative for dizziness and weakness.       Objective    BP (!) 140/81 (BP Location: Left Arm, Patient Position: Sitting, Cuff Size: Normal)   Pulse 91   Wt 186 lb 14.4 oz (84.8 kg)   SpO2 98%   BMI 32.59 kg/m   Physical Exam   General Appearance:    Mildly obese female, alert, cooperative, in no acute distress  Eyes:    PERRL, conjunctiva/corneas clear, EOM's intact       Abdomen:   bowel sounds present and normal in all 4 quadrants, soft, or round. Mild tenderness LLQ. No rebound or guarding. No CVA tenderness     Assessment & Plan    1. Diverticulitis (Primary)  -  ciprofloxacin  (CIPRO ) 500 MG tablet; Take 1 tablet (500 mg total) by mouth 2 (two) times daily for 7 days.  Dispense: 14 tablet; Refill: 0 - metroNIDAZOLE  (FLAGYL ) 500 MG tablet; Take 1 tablet (500 mg total) by mouth 2 (two) times daily for 7 days.  Dispense: 14 tablet; Refill: 0  Call if symptoms change or if not rapidly improving.        Nancyann Perry, MD  [facility] [phone] (phone) [phone] (fax)  [facility]

## 2024-10-18 ENCOUNTER — Encounter: Payer: Self-pay | Admitting: Family Medicine

## 2024-10-18 ENCOUNTER — Ambulatory Visit: Payer: Self-pay | Admitting: Family Medicine

## 2024-10-18 VITALS — BP 136/61 | HR 87 | Resp 16 | Ht 63.0 in | Wt 180.0 lb

## 2024-10-18 DIAGNOSIS — G8929 Other chronic pain: Secondary | ICD-10-CM

## 2024-10-18 DIAGNOSIS — Z0000 Encounter for general adult medical examination without abnormal findings: Secondary | ICD-10-CM

## 2024-10-18 DIAGNOSIS — E559 Vitamin D deficiency, unspecified: Secondary | ICD-10-CM

## 2024-10-18 DIAGNOSIS — R1011 Right upper quadrant pain: Secondary | ICD-10-CM

## 2024-10-18 DIAGNOSIS — R739 Hyperglycemia, unspecified: Secondary | ICD-10-CM

## 2024-10-18 DIAGNOSIS — R5383 Other fatigue: Secondary | ICD-10-CM

## 2024-10-18 DIAGNOSIS — F17210 Nicotine dependence, cigarettes, uncomplicated: Secondary | ICD-10-CM

## 2024-10-18 DIAGNOSIS — J439 Emphysema, unspecified: Secondary | ICD-10-CM

## 2024-10-18 DIAGNOSIS — K219 Gastro-esophageal reflux disease without esophagitis: Secondary | ICD-10-CM

## 2024-10-18 NOTE — Progress Notes (Signed)
 Complete physical exam   Patient: Theresa White   DOB: [birth year]   68 y.o. Female  MRN: [id number] Visit Date: 10/18/2024  Today's healthcare provider: Nancyann Perry, MD   Chief Complaint  Patient presents with   Annual Exam    CPE. No other concerns ( no to all vaccines)   Subjective    Discussed the use of AI scribe software for clinical note transcription with the patient, who gave verbal consent to proceed.  History of Present Illness   Theresa White is a 68 year old female who presents for an annual physical exam.  She frequently feels tired, attributing this to aging, and experiences daytime sleepiness, particularly in the afternoons, without napping. She suspects her CPAP machine, which is about ten years old and displays a message about the motor, may contribute to her fatigue. She participates in water aerobics twice a week but feels sleepy during the sessions.  She describes an episode of shortness of breath after climbing stairs at a birthday party, which she found concerning. She experiences occasional wheezing but denies frequent or persistent symptoms. She is scheduled for a lung scan on November 15, 2024. She continues to smoke occasionally, though she has reduced her consumption from previous levels.  She experiences upper abdominal pain described as a cramp occurring two to three times a week for the past couple of years, with increased frequency recently. The pain is located in the upper stomach and can radiate to either side, worsening after eating. She still has her gallbladder.  She reports hearing loss, confirmed by a hearing test last year, but has not yet pursued hearing aids. She attributes some of her hearing issues to past work in a noisy environment. She also mentions a recent eye exam where she was diagnosed with 'eye mites' and is currently using eye drops for treatment.  She is currently taking Aciphex  for reflux, which she states is effective. She has  enough medication to last through the end of the year.      HPI     Annual Exam    Additional comments: CPE. No other concerns ( no to all vaccines)      Last edited by Marylen Odella CROME, CMA on 10/18/2024  8:29 AM.       Past Medical History:  Diagnosis Date   Bowel trouble 1995   Diffuse cystic mastopathy 2012   GERD (gastroesophageal reflux disease)    Sleep apnea    Tension headache    Past Surgical History:  Procedure Laterality Date   ABDOMINAL EXPLORATION SURGERY     ABDOMINAL HYSTERECTOMY     APPENDECTOMY     BLEPHAROPLASTY Bilateral 06/2021   COLONOSCOPY WITH PROPOFOL  N/A 12/20/2019   Procedure: COLONOSCOPY WITH PROPOFOL ;  Surgeon: Unk Corinn Skiff, MD;  Location: [facility];  Service: Gastroenterology;  Laterality: N/A;   FOOT SURGERY Left 2010   FRACTURE SURGERY     left foot   TONSILLECTOMY AND ADENOIDECTOMY     TOTAL ABDOMINAL HYSTERECTOMY W/ BILATERAL SALPINGOOPHORECTOMY  1996   Laparoscopically assisted for pelvic pain   TUBAL LIGATION     Social History   Socioeconomic History   Marital status: Married    Spouse name: Not on file   Number of children: 2   Years of education: Not on file   Highest education level: Associate degree: occupational, scientist, product/process development, or vocational program  Occupational History   Occupation: Production planning  Tobacco Use   Smoking status:  Some Days    Current packs/day: 0.00    Average packs/day: 0.8 packs/day for 25.0 years (18.8 ttl pk-yrs)    Types: Cigarettes    Start date: 11/02/1995    Last attempt to quit: 11/01/2020    Years since quitting: 3.9   Smokeless tobacco: Never   Tobacco comments:    Started as teenager, averaging 1 ppd since  Vaping Use   Vaping status: Never Used  Substance and Sexual Activity   Alcohol use: No    Alcohol/week: 0.0 standard drinks of alcohol   Drug use: No   Sexual activity: Not on file  Other Topics Concern   Not on file  Social History Narrative   Not on file    Social Drivers of Health   Financial Resource Strain: Low Risk  (09/21/2024)   Overall Financial Resource Strain (CARDIA)    Difficulty of Paying Living Expenses: Not hard at all  Food Insecurity: No Food Insecurity (09/21/2024)   Hunger Vital Sign    Worried About Running Out of Food in the Last Year: Never true    Ran Out of Food in the Last Year: Never true  Transportation Needs: No Transportation Needs (09/21/2024)   PRAPARE - Administrator, Civil Service (Medical): No    Lack of Transportation (Non-Medical): No  Physical Activity: Sufficiently Active (09/21/2024)   Exercise Vital Sign    Days of Exercise per Week: 2 days    Minutes of Exercise per Session: 90 min  Recent Concern: Physical Activity - Insufficiently Active (07/06/2024)   Exercise Vital Sign    Days of Exercise per Week: 2 days    Minutes of Exercise per Session: 60 min  Stress: No Stress Concern Present (09/21/2024)   [employer] of Occupational Health - Occupational Stress Questionnaire    Feeling of Stress: Not at all  Social Connections: Unknown (09/21/2024)   Social Connection and Isolation Panel    Frequency of Communication with Friends and Family: More than three times a week    Frequency of Social Gatherings with Friends and Family: Three times a week    Attends Religious Services: More than 4 times per year    Active Member of Clubs or Organizations: Patient declined    Attends Banker Meetings: Not on file    Marital Status: Married  Intimate Partner Violence: Not At Risk (07/06/2024)   Humiliation, Afraid, Rape, and Kick questionnaire    Fear of Current or Ex-Partner: No    Emotionally Abused: No    Physically Abused: No    Sexually Abused: No   Family Status  Relation Name Status   Mother Joy Deceased at age 14   Father Johnny Deceased at age 38   Sister  Deceased at age 16   Sister  Deceased at age 60   Sister  Alive   Brother  Deceased   Brother  Alive    Neg Hx  (Not Specified)  No partnership data on file   Family History  Problem Relation Age of Onset   Kidney failure Mother    Stroke Mother    Diabetes Mother    Emphysema Father    Hypertension Father    Aneurysm Sister        Cerebral   Cervical cancer Sister 20   Healthy Sister    Bladder Cancer Brother 45   Healthy Brother    Breast cancer Neg Hx    Allergies  Allergen Reactions   Codeine  Itching    Patient Care Team: Gasper Nancyann BRAVO, MD as PCP - General (Family Medicine) Dellie Louanne MATSU, MD (General Surgery) Milissa Hamming, MD as Referring Physician (Otolaryngology) Cleotilde Barrio, MD (Orthopedic Surgery) Unk Corinn Skiff, MD as Consulting Physician (Gastroenterology) Laurice Francis NOVAK, OD (Optometry)   Medications: Outpatient Medications Prior to Visit  Medication Sig   ascorbic acid (VITAMIN C) 1000 MG tablet Take 1,000 mg by mouth daily.   aspirin 81 MG tablet Take 81 mg by mouth daily.   CALCIUM PO Take by mouth daily.   cetirizine (ZYRTEC) 10 MG tablet Zyrtec 10 mg tablet   1 tablet every other day by oral route.   Cholecalciferol (VITAMIN D ) 125 MCG (5000 UT) CAPS Take 1 capsule by mouth daily.   Cyanocobalamin (VITAMIN B-12 PO) Take by mouth daily.   loratadine (CLARITIN) 10 MG tablet Take 10 mg by mouth daily.   montelukast  (SINGULAIR ) 10 MG tablet Take 1 tablet (10 mg total) by mouth daily.   Potassium 99 MG TABS Take 99 mg by mouth.   RABEprazole  (ACIPHEX ) 20 MG tablet Take 1 tablet (20 mg total) by mouth daily.   No facility-administered medications prior to visit.    Review of Systems  Constitutional:  Positive for fatigue. Negative for appetite change, chills and fever.  Respiratory:  Positive for shortness of breath. Negative for chest tightness.   Cardiovascular:  Negative for chest pain and palpitations.  Gastrointestinal:  Negative for abdominal pain, nausea and vomiting.  Neurological:  Negative for dizziness and weakness.       Objective    BP 136/61 (BP Location: Right Arm, Patient Position: Sitting)   Pulse 87   Resp 16   Ht 5' 3 (1.6 m)   Wt 180 lb (81.6 kg)   SpO2 100%   BMI 31.89 kg/m    Physical Exam  General Appearance:    Obese female. Alert, cooperative, in no acute distress, appears stated age   Head:    Normocephalic, without obvious abnormality, atraumatic  Eyes:    PERRL, conjunctiva/corneas clear, EOM's intact, fundi    benign, both eyes  Ears:    Normal TM's and external ear canals, both ears  Nose:   Nares normal, septum midline, mucosa normal, no drainage    or sinus tenderness  Throat:   Lips, mucosa, and tongue normal; teeth and gums normal  Neck:   Supple, symmetrical, trachea midline, no adenopathy;    thyroid :  no enlargement/tenderness/nodules; no carotid   bruit or JVD  Back:     Symmetric, no curvature, ROM normal, no CVA tenderness  Lungs:     Clear to auscultation bilaterally, respirations unlabored  Chest Wall:    No tenderness or deformity   Heart:    Normal heart rate. Normal rhythm. No murmurs, rubs, or gallops.   Breast Exam:    deferred  Abdomen:     Soft, non-tender, bowel sounds active all four quadrants,    no masses, no organomegaly  Pelvic:    deferred  Extremities:   All extremities are intact. No cyanosis or edema  Pulses:   2+ and symmetric all extremities  Skin:   Skin color, texture, turgor normal, no rashes or lesions  Lymph nodes:   Cervical, supraclavicular, and axillary nodes normal  Neurologic:   CNII-XII intact, normal strength, sensation and reflexes    throughout       Last depression screening scores    10/18/2024    8:57 AM 09/22/2024  11:22 AM 07/06/2024    2:00 PM  PHQ 2/9 Scores  PHQ - 2 Score 0 0 0  PHQ- 9 Score 1  0      Data saved with a previous flowsheet row definition   Last fall risk screening    10/18/2024    8:57 AM  Fall Risk   Falls in the past year? 0  Number falls in past yr: 0  Injury with Fall? 0    Last Audit-C alcohol use screening    09/21/2024    4:53 PM  Alcohol Use Disorder Test (AUDIT)  1. How often do you have a drink containing alcohol? 0  3. How often do you have six or more drinks on one occasion? 0   A score of 3 or more in women, and 4 or more in men indicates increased risk for alcohol abuse, EXCEPT if all of the points are from question 1   No results found for any visits on 10/18/24.  Assessment & Plan    Routine Health Maintenance and Physical Exam  Exercise Activities and Dietary recommendations  Goals      DIET - EAT MORE FRUITS AND VEGETABLES         There is no immunization history on file for this patient.  Health Maintenance  Topic Date Due   DTaP/Tdap/Td (1 - Tdap) Never done   Pneumococcal Vaccine: 50+ Years (1 of 2 - PCV) Never done   Zoster Vaccines- Shingrix (1 of 2) Never done   Influenza Vaccine  Never done   COVID-19 Vaccine (1 - 2025-26 season) Never done   Lung Cancer Screening  11/12/2024   Mammogram  05/26/2025   Medicare Annual Wellness (AWV)  07/06/2025   DEXA SCAN  10/23/2026   Colonoscopy  12/19/2026   Hepatitis C Screening  Completed   Meningococcal B Vaccine  Aged Out    Discussed health benefits of physical activity, and encouraged her to engage in regular exercise appropriate for her age and condition.     Adult Wellness Visit Routine adult wellness visit. - Performed physical examination. - Ordered blood work including blood count, glucose, and cholesterol levels.  Emphysema Intermittent wheezing and exertional dyspnea, particularly after climbing stairs. Smoking may contribute to symptoms. - Consider inhaler if symptoms worsen or become more persistent.  Nicotine dependence, cigarettes Continues to smoke occasionally, reduced from previous levels. Acknowledges need to quit smoking. - Encouraged smoking cessation.  Fatigue and daytime sleepiness Reports fatigue and daytime sleepiness, possibly related to  CPAP machine issues or other factors such as anemia or hyperglycemia. - Ordered blood work to check for anemia and hyperglycemia. - CBC - Comprehensive metabolic panel with GFR - TSH + free T4  Suspected sleep apnea (CPAP user, needs replacement) CPAP machine is approximately 68 years old and may need replacement. Reports daytime sleepiness and fatigue, possibly related to CPAP issues. - She is Contacting ENT for CPAP replacement.  Chronic right upper quadrant abdominal pain Chronic right upper quadrant abdominal pain, occurring 2-3 times a week, possibly related to gallbladder issues. Pain worsens with eating. - Ordered abdominal ultrasound to evaluate gallbladder.  Gastroesophageal reflux disease GERD is well-controlled with current medication (Aciphex ). - Continue current GERD medication (Aciphex ).  Hyperglycemia (screening for diabetes) Screening for diabetes due to hyperglycemia. - Ordered blood work to screen for diabetes.  Hearing loss Confirmed hearing loss with previous ENT evaluation. No current use of hearing aids. - Discussed potential need for hearing aids.  Avitaminosis D  - VITAMIN D  25 Hydroxy (Vit-D Deficiency, Fractures)  Smoking greater than 30 pack years UTD on lung cancer screening. Working on smoking cessation.   Hyperglycemia Lab Results  Component Value Date   HGBA1C 5.9 (H) 10/17/2023    - Hemoglobin A1c   Return in about 1 year (around 10/18/2025) for Yearly Physical.        Nancyann Perry, MD  [facility] [phone] (phone) [phone] (fax)  [facility]

## 2024-10-18 NOTE — Patient Instructions (Signed)
 Please review the attached list of medications and notify my office if there are any errors.   I recommend that you get the Prevnar 20 vaccine to protect yourself from certain dangerous strains of pneumonia. You can get Prevnar 20 at your pharmacy, or call our office at [phone] at your earliest convenience to schedule this vaccine.

## 2024-10-19 ENCOUNTER — Ambulatory Visit: Payer: Self-pay | Admitting: Family Medicine

## 2024-10-19 LAB — LIPID PANEL
Chol/HDL Ratio: 2.8 ratio (ref 0.0–4.4)
Cholesterol, Total: 132 mg/dL (ref 100–199)
HDL: 48 mg/dL (ref >39)
LDL Chol Calc (NIH): 57 mg/dL (ref 0–99)
Triglycerides: 160 mg/dL — ABNORMAL HIGH (ref 0–149)
VLDL Cholesterol Cal: 27 mg/dL (ref 5–40)

## 2024-10-19 LAB — CBC
Hematocrit: 42.9 % (ref 34.0–46.6)
Hemoglobin: 14.3 g/dL (ref 11.1–15.9)
MCH: 32.5 pg (ref 26.6–33.0)
MCHC: 33.3 g/dL (ref 31.5–35.7)
MCV: 98 fL — ABNORMAL HIGH (ref 79–97)
Platelets: 302 x10E3/uL (ref 150–450)
RBC: 4.4 x10E6/uL (ref 3.77–5.28)
RDW: 12.8 % (ref 11.7–15.4)
WBC: 8 x10E3/uL (ref 3.4–10.8)

## 2024-10-19 LAB — COMPREHENSIVE METABOLIC PANEL WITH GFR
ALT: 7 IU/L (ref 0–32)
AST: 20 IU/L (ref 0–40)
Albumin: 4.2 g/dL (ref 3.9–4.9)
Alkaline Phosphatase: 119 IU/L (ref 49–135)
BUN/Creatinine Ratio: 19 (ref 12–28)
BUN: 19 mg/dL (ref 8–27)
Bilirubin Total: 0.4 mg/dL (ref 0.0–1.2)
CO2: 23 mmol/L (ref 20–29)
Calcium: 10 mg/dL (ref 8.7–10.3)
Chloride: 108 mmol/L — ABNORMAL HIGH (ref 96–106)
Creatinine, Ser: 1.01 mg/dL — ABNORMAL HIGH (ref 0.57–1.00)
Globulin, Total: 2.4 g/dL (ref 1.5–4.5)
Glucose: 103 mg/dL — ABNORMAL HIGH (ref 70–99)
Potassium: 5.4 mmol/L — ABNORMAL HIGH (ref 3.5–5.2)
Sodium: 145 mmol/L — ABNORMAL HIGH (ref 134–144)
Total Protein: 6.6 g/dL (ref 6.0–8.5)
eGFR: 61 mL/min/1.73 (ref >59)

## 2024-10-19 LAB — VITAMIN D 25 HYDROXY (VIT D DEFICIENCY, FRACTURES): Vit D, 25-Hydroxy: 59.5 ng/mL (ref 30.0–100.0)

## 2024-10-19 LAB — HEMOGLOBIN A1C
Est. average glucose Bld gHb Est-mCnc: 117 mg/dL
Hgb A1c MFr Bld: 5.7 % — ABNORMAL HIGH (ref 4.8–5.6)

## 2024-10-19 LAB — TSH+FREE T4
Free T4: 1.03 ng/dL (ref 0.82–1.77)
TSH: 3.03 u[IU]/mL (ref 0.450–4.500)

## 2024-10-21 ENCOUNTER — Ambulatory Visit
Admission: RE | Admit: 2024-10-21 | Discharge: 2024-10-21 | Disposition: A | Discharge status: Home / Self Care | Source: Ambulatory Visit | Attending: Family Medicine | Admitting: Family Medicine

## 2024-10-21 DIAGNOSIS — M7541 Impingement syndrome of right shoulder: Secondary | ICD-10-CM | POA: Diagnosis not present

## 2024-10-21 DIAGNOSIS — R1011 Right upper quadrant pain: Secondary | ICD-10-CM | POA: Diagnosis not present

## 2024-10-21 DIAGNOSIS — M7521 Bicipital tendinitis, right shoulder: Secondary | ICD-10-CM | POA: Diagnosis not present

## 2024-10-21 DIAGNOSIS — G8929 Other chronic pain: Secondary | ICD-10-CM | POA: Insufficient documentation

## 2024-10-21 DIAGNOSIS — R932 Abnormal findings on diagnostic imaging of liver and biliary tract: Secondary | ICD-10-CM | POA: Diagnosis not present

## 2024-10-27 DIAGNOSIS — H2513 Age-related nuclear cataract, bilateral: Secondary | ICD-10-CM | POA: Diagnosis not present

## 2024-10-27 DIAGNOSIS — H01004 Unspecified blepharitis left upper eyelid: Secondary | ICD-10-CM | POA: Diagnosis not present

## 2024-10-27 DIAGNOSIS — H01001 Unspecified blepharitis right upper eyelid: Secondary | ICD-10-CM | POA: Diagnosis not present

## 2024-10-29 ENCOUNTER — Telehealth: Payer: Self-pay

## 2024-11-01 NOTE — Telephone Encounter (Signed)
 SABRA

## 2024-11-15 ENCOUNTER — Ambulatory Visit: Admission: RE | Admit: 2024-11-15 | Discharge: 2024-11-15 | Attending: Acute Care | Admitting: Acute Care

## 2024-11-15 DIAGNOSIS — Z122 Encounter for screening for malignant neoplasm of respiratory organs: Secondary | ICD-10-CM | POA: Insufficient documentation

## 2024-11-15 DIAGNOSIS — Z87891 Personal history of nicotine dependence: Secondary | ICD-10-CM | POA: Diagnosis present

## 2024-11-19 ENCOUNTER — Other Ambulatory Visit: Payer: Self-pay

## 2024-11-19 DIAGNOSIS — Z87891 Personal history of nicotine dependence: Secondary | ICD-10-CM

## 2024-11-19 DIAGNOSIS — F17210 Nicotine dependence, cigarettes, uncomplicated: Secondary | ICD-10-CM

## 2024-11-19 DIAGNOSIS — Z122 Encounter for screening for malignant neoplasm of respiratory organs: Secondary | ICD-10-CM

## 2024-11-30 ENCOUNTER — Other Ambulatory Visit (HOSPITAL_COMMUNITY): Payer: Self-pay

## 2024-12-14 ENCOUNTER — Other Ambulatory Visit: Payer: Self-pay | Admitting: Family Medicine

## 2024-12-14 ENCOUNTER — Other Ambulatory Visit: Payer: Self-pay

## 2024-12-14 DIAGNOSIS — K219 Gastro-esophageal reflux disease without esophagitis: Secondary | ICD-10-CM

## 2024-12-14 DIAGNOSIS — J449 Chronic obstructive pulmonary disease, unspecified: Secondary | ICD-10-CM

## 2024-12-14 MED ORDER — MONTELUKAST SODIUM 10 MG PO TABS
10.0000 mg | ORAL_TABLET | Freq: Every day | ORAL | 3 refills | Status: AC
  Filled 2024-12-14: qty 90, 90d supply, fill #0

## 2024-12-14 MED ORDER — RABEPRAZOLE SODIUM 20 MG PO TBEC
20.0000 mg | DELAYED_RELEASE_TABLET | Freq: Every day | ORAL | 3 refills | Status: AC
  Filled 2024-12-14: qty 90, 90d supply, fill #0

## 2025-07-12 ENCOUNTER — Ambulatory Visit

## 2025-07-13 ENCOUNTER — Ambulatory Visit
